# Patient Record
Sex: Female | Born: 1985 | Hispanic: No | Marital: Single | Smoking: Current every day smoker
Health system: Southern US, Community
[De-identification: ages and names within clinical notes are randomized; demographics above are authoritative.]

## PROBLEM LIST (undated history)

## (undated) ENCOUNTER — Inpatient Hospital Stay (HOSPITAL_COMMUNITY): Payer: Self-pay

## (undated) DIAGNOSIS — Z3490 Encounter for supervision of normal pregnancy, unspecified, unspecified trimester: Secondary | ICD-10-CM

## (undated) DIAGNOSIS — N83201 Unspecified ovarian cyst, right side: Secondary | ICD-10-CM

## (undated) DIAGNOSIS — E162 Hypoglycemia, unspecified: Secondary | ICD-10-CM

## (undated) DIAGNOSIS — T8859XA Other complications of anesthesia, initial encounter: Secondary | ICD-10-CM

## (undated) DIAGNOSIS — T4145XA Adverse effect of unspecified anesthetic, initial encounter: Secondary | ICD-10-CM

## (undated) DIAGNOSIS — Z30017 Encounter for initial prescription of implantable subdermal contraceptive: Secondary | ICD-10-CM

## (undated) HISTORY — PX: NO PAST SURGERIES: SHX2092

## (undated) HISTORY — DX: Encounter for supervision of normal pregnancy, unspecified, unspecified trimester: Z34.90

## (undated) HISTORY — DX: Encounter for initial prescription of implantable subdermal contraceptive: Z30.017

## (undated) HISTORY — DX: Unspecified ovarian cyst, right side: N83.201

## (undated) HISTORY — DX: Hypoglycemia, unspecified: E16.2

---
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
## 2014-04-14 ENCOUNTER — Encounter: Payer: Self-pay | Admitting: Adult Health

## 2014-04-14 ENCOUNTER — Ambulatory Visit (INDEPENDENT_AMBULATORY_CARE_PROVIDER_SITE_OTHER): Payer: Medicaid Other | Admitting: Adult Health

## 2014-04-14 VITALS — BP 130/74 | Ht 59.0 in | Wt 140.0 lb

## 2014-04-14 DIAGNOSIS — Z3490 Encounter for supervision of normal pregnancy, unspecified, unspecified trimester: Secondary | ICD-10-CM

## 2014-04-14 DIAGNOSIS — O09899 Supervision of other high risk pregnancies, unspecified trimester: Secondary | ICD-10-CM | POA: Insufficient documentation

## 2014-04-14 DIAGNOSIS — Z3201 Encounter for pregnancy test, result positive: Secondary | ICD-10-CM

## 2014-04-14 HISTORY — DX: Encounter for supervision of normal pregnancy, unspecified, unspecified trimester: Z34.90

## 2014-04-14 LAB — POCT URINE PREGNANCY: Preg Test, Ur: POSITIVE

## 2014-04-14 NOTE — Patient Instructions (Signed)
Second Trimester of Pregnancy The second trimester is from week 13 through week 28, months 4 through 6. The second trimester is often a time when you feel your best. Your body has also adjusted to being pregnant, and you begin to feel better physically. Usually, morning sickness has lessened or quit completely, you may have more energy, and you may have an increase in appetite. The second trimester is also a time when the fetus is growing rapidly. At the end of the sixth month, the fetus is about 9 inches long and weighs about 1 pounds. You will likely begin to feel the baby move (quickening) between 18 and 20 weeks of the pregnancy. BODY CHANGES Your body goes through many changes during pregnancy. The changes vary from woman to woman.   Your weight will continue to increase. You will notice your lower abdomen bulging out.  You may begin to get stretch marks on your hips, abdomen, and breasts.  You may develop headaches that can be relieved by medicines approved by your health care provider.  You may urinate more often because the fetus is pressing on your bladder.  You may develop or continue to have heartburn as a result of your pregnancy.  You may develop constipation because certain hormones are causing the muscles that push waste through your intestines to slow down.  You may develop hemorrhoids or swollen, bulging veins (varicose veins).  You may have back pain because of the weight gain and pregnancy hormones relaxing your joints between the bones in your pelvis and as a result of a shift in weight and the muscles that support your balance.  Your breasts will continue to grow and be tender.  Your gums may bleed and may be sensitive to brushing and flossing.  Dark spots or blotches (chloasma, mask of pregnancy) may develop on your face. This will likely fade after the baby is born.  A dark line from your belly button to the pubic area (linea nigra) may appear. This will likely fade  after the baby is born.  You may have changes in your hair. These can include thickening of your hair, rapid growth, and changes in texture. Some women also have hair loss during or after pregnancy, or hair that feels dry or thin. Your hair will most likely return to normal after your baby is born. WHAT TO EXPECT AT YOUR PRENATAL VISITS During a routine prenatal visit:  You will be weighed to make sure you and the fetus are growing normally.  Your blood pressure will be taken.  Your abdomen will be measured to track your baby's growth.  The fetal heartbeat will be listened to.  Any test results from the previous visit will be discussed. Your health care provider may ask you:  How you are feeling.  If you are feeling the baby move.  If you have had any abnormal symptoms, such as leaking fluid, bleeding, severe headaches, or abdominal cramping.  If you have any questions. Other tests that may be performed during your second trimester include:  Blood tests that check for:  Low iron levels (anemia).  Gestational diabetes (between 24 and 28 weeks).  Rh antibodies.  Urine tests to check for infections, diabetes, or protein in the urine.  An ultrasound to confirm the proper growth and development of the baby.  An amniocentesis to check for possible genetic problems.  Fetal screens for spina bifida and Down syndrome. HOME CARE INSTRUCTIONS   Avoid all smoking, herbs, alcohol, and unprescribed   drugs. These chemicals affect the formation and growth of the baby.  Follow your health care provider's instructions regarding medicine use. There are medicines that are either safe or unsafe to take during pregnancy.  Exercise only as directed by your health care provider. Experiencing uterine cramps is a good sign to stop exercising.  Continue to eat regular, healthy meals.  Wear a good support bra for breast tenderness.  Do not use hot tubs, steam rooms, or saunas.  Wear your  seat belt at all times when driving.  Avoid raw meat, uncooked cheese, cat litter boxes, and soil used by cats. These carry germs that can cause birth defects in the baby.  Take your prenatal vitamins.  Try taking a stool softener (if your health care provider approves) if you develop constipation. Eat more high-fiber foods, such as fresh vegetables or fruit and whole grains. Drink plenty of fluids to keep your urine clear or pale yellow.  Take warm sitz baths to soothe any pain or discomfort caused by hemorrhoids. Use hemorrhoid cream if your health care provider approves.  If you develop varicose veins, wear support hose. Elevate your feet for 15 minutes, 3-4 times a day. Limit salt in your diet.  Avoid heavy lifting, wear low heel shoes, and practice good posture.  Rest with your legs elevated if you have leg cramps or low back pain.  Visit your dentist if you have not gone yet during your pregnancy. Use a soft toothbrush to brush your teeth and be gentle when you floss.  A sexual relationship may be continued unless your health care provider directs you otherwise.  Continue to go to all your prenatal visits as directed by your health care provider. SEEK MEDICAL CARE IF:   You have dizziness.  You have mild pelvic cramps, pelvic pressure, or nagging pain in the abdominal area.  You have persistent nausea, vomiting, or diarrhea.  You have a bad smelling vaginal discharge.  You have pain with urination. SEEK IMMEDIATE MEDICAL CARE IF:   You have a fever.  You are leaking fluid from your vagina.  You have spotting or bleeding from your vagina.  You have severe abdominal cramping or pain.  You have rapid weight gain or loss.  You have shortness of breath with chest pain.  You notice sudden or extreme swelling of your face, hands, ankles, feet, or legs.  You have not felt your baby move in over an hour.  You have severe headaches that do not go away with  medicine.  You have vision changes. Document Released: 03/05/2001 Document Revised: 03/16/2013 Document Reviewed: 05/12/2012 ExitCare Patient Information 2015 [employer]. This information is not intended to replace advice given to you by your health care provider. Make sure you discuss any questions you have with your health care provider. Return in 1 week for US 

## 2014-04-14 NOTE — Progress Notes (Signed)
Subjective:     Patient ID: Lindsey Mccoy, female   DOB: 05/17/1985, 29 y.o.   MRN: [id number]  HPI Lindsey Mccoy is a 29 year old white female in for UPT.  Review of Systems See HPI Reviewed past medical,surgical, social and family history. Reviewed medications and allergies.     Objective:   Physical Exam BP 130/74 mmHg  Ht 4\' 11"  (1.499 m)  Wt 140 lb (63.504 kg)  BMI 28.26 kg/m2  LMP 09/23/2015UPT +, about 17 +1 week by LMP EDD 09/22/14    Assessment:     Pregnant +UPT    Plan:     Return in 1 week for dating US  Review handout on second trimester

## 2014-04-21 ENCOUNTER — Other Ambulatory Visit: Payer: Self-pay | Admitting: Adult Health

## 2014-04-21 ENCOUNTER — Encounter: Payer: Self-pay | Admitting: Adult Health

## 2014-04-21 ENCOUNTER — Ambulatory Visit (INDEPENDENT_AMBULATORY_CARE_PROVIDER_SITE_OTHER): Payer: Medicaid Other

## 2014-04-21 DIAGNOSIS — O3680X1 Pregnancy with inconclusive fetal viability, fetus 1: Secondary | ICD-10-CM

## 2014-04-21 DIAGNOSIS — O0932 Supervision of pregnancy with insufficient antenatal care, second trimester: Secondary | ICD-10-CM

## 2014-04-21 DIAGNOSIS — Z3490 Encounter for supervision of normal pregnancy, unspecified, unspecified trimester: Secondary | ICD-10-CM

## 2014-04-21 NOTE — Progress Notes (Signed)
U/S(18+1wks)-active fetus, meas c/w LMP dates, fluid WNL, posterior Gr 0 placenta, cx appears closed, bilateral adnexa appears WNL, female fetus, will complete anatomy screen at next U/S appt

## 2014-04-28 ENCOUNTER — Encounter: Payer: Self-pay | Admitting: Advanced Practice Midwife

## 2014-04-28 ENCOUNTER — Ambulatory Visit (INDEPENDENT_AMBULATORY_CARE_PROVIDER_SITE_OTHER): Payer: Medicaid Other | Admitting: Advanced Practice Midwife

## 2014-04-28 VITALS — BP 118/58 | Wt 142.0 lb

## 2014-04-28 DIAGNOSIS — Z8739 Personal history of other diseases of the musculoskeletal system and connective tissue: Secondary | ICD-10-CM

## 2014-04-28 DIAGNOSIS — Z1371 Encounter for nonprocreative screening for genetic disease carrier status: Secondary | ICD-10-CM

## 2014-04-28 DIAGNOSIS — O0932 Supervision of pregnancy with insufficient antenatal care, second trimester: Secondary | ICD-10-CM

## 2014-04-28 DIAGNOSIS — Z3482 Encounter for supervision of other normal pregnancy, second trimester: Secondary | ICD-10-CM

## 2014-04-28 DIAGNOSIS — Z118 Encounter for screening for other infectious and parasitic diseases: Secondary | ICD-10-CM

## 2014-04-28 DIAGNOSIS — Z113 Encounter for screening for infections with a predominantly sexual mode of transmission: Secondary | ICD-10-CM

## 2014-04-28 DIAGNOSIS — Z114 Encounter for screening for human immunodeficiency virus [HIV]: Secondary | ICD-10-CM

## 2014-04-28 DIAGNOSIS — Z331 Pregnant state, incidental: Secondary | ICD-10-CM

## 2014-04-28 DIAGNOSIS — Z0184 Encounter for antibody response examination: Secondary | ICD-10-CM

## 2014-04-28 DIAGNOSIS — Z87828 Personal history of other (healed) physical injury and trauma: Secondary | ICD-10-CM

## 2014-04-28 DIAGNOSIS — Z3492 Encounter for supervision of normal pregnancy, unspecified, second trimester: Secondary | ICD-10-CM

## 2014-04-28 DIAGNOSIS — Z3682 Encounter for antenatal screening for nuchal translucency: Secondary | ICD-10-CM

## 2014-04-28 DIAGNOSIS — Z1159 Encounter for screening for other viral diseases: Secondary | ICD-10-CM

## 2014-04-28 DIAGNOSIS — Z0283 Encounter for blood-alcohol and blood-drug test: Secondary | ICD-10-CM

## 2014-04-28 DIAGNOSIS — Z1389 Encounter for screening for other disorder: Secondary | ICD-10-CM

## 2014-04-28 DIAGNOSIS — Z3490 Encounter for supervision of normal pregnancy, unspecified, unspecified trimester: Secondary | ICD-10-CM

## 2014-04-28 LAB — POCT URINALYSIS DIPSTICK
GLUCOSE UA: NEGATIVE
Ketones, UA: NEGATIVE
Leukocytes, UA: NEGATIVE
Nitrite, UA: NEGATIVE
Protein, UA: NEGATIVE
RBC UA: NEGATIVE

## 2014-04-28 LAB — OB RESULTS CONSOLE ABO/RH: RH Type: POSITIVE

## 2014-04-28 LAB — OB RESULTS CONSOLE RUBELLA ANTIBODY, IGM: Rubella: IMMUNE

## 2014-04-28 MED ORDER — ONDANSETRON HCL 4 MG PO TABS: 4.0000 mg | ORAL_TABLET | Freq: Three times a day (TID) | ORAL | Status: DC | PRN

## 2014-04-28 NOTE — Progress Notes (Signed)
  Subjective:    Lindsey Mccoy is a [id number] 5767w1d being seen today for her first obstetrical visit.  Her obstetrical history is significant for postpartum hemorrhage.  Pregnancy history fully reviewed.  Patient reports peristant nausea and daily HA, sometimes frontal, usually in back of head.  + trigger point right trapezius.  Filed Vitals:   04/28/14 1150  BP: 152/78  Weight: 142 lb (64.411 kg)    HISTORY: OB History  Gravida Para Term Preterm AB SAB TAB Ectopic Multiple Living  3 1 1  0 1 1 0 0 0 1    # Outcome Date GA Lbr Len/2nd Weight Sex Delivery Anes PTL Lv  3 Current           2 Term 06/25/07 2724w0d   F Vag-Spont EPI N Y     Complications: Other Excessive Bleeding  1 SAB              Past Medical History  Diagnosis Date  . Hypoglycemia   . Cyst of ovary, right   . Pregnant 04/14/2014   Past Surgical History  Procedure Laterality Date  . No past surgeries     Family History  Problem Relation Age of Onset  . Heart disease Paternal Grandfather   . Cancer Paternal Grandmother     breast  . Asthma Maternal Grandmother   . Diabetes Father   . Hypertension Father   . Migraines Mother   . Pancreatitis Brother      Exam                                           Skin: normal coloration and turgor, no rashes    Neurologic: oriented, normal, normal mood   Extremities: normal strength, tone, and muscle mass   HEENT PERRLA   Mouth/Teeth mucous membranes moist,poor dentition   Neck supple and no masses   Cardiovascular: regular rate and rhythm   Respiratory:  appears well, vitals normal, no respiratory distress, acyanotic   Abdomen: soft, non-tender;  FHR: 150          Assessment:    Pregnancy: [id number] Patient Active Problem List   Diagnosis Date Noted  . Late prenatal care in second trimester 04/28/2014  . Pregnant 04/14/2014        Plan:     Initial labs drawn. Continue prenatal vitamins  Zofran 4mg  ODT Ice/massage/stretch for  occipital HA Problem list reviewed and updated  Reviewed n/v relief measures and warning s/s to report  Reviewed recommended weight gain based on pre-gravid BMI  Encouraged well-balanced diet Genetic Screening discussed Quad Screen: requested.  Ultrasound discussed; fetal survey: requested.  Follow up in 1 weeks for anatomy scan and 4 weeks for Low-risk ob appt  CRESENZO-DISHMAN,[name] 04/28/2014

## 2014-04-28 NOTE — Patient Instructions (Signed)

## 2014-04-28 NOTE — Addendum Note (Signed)
Addended by: Criss AlvinePULLIAM, CHRYSTAL G on: 04/28/2014 03:09 PM   Modules accepted: Orders

## 2014-04-29 ENCOUNTER — Other Ambulatory Visit: Payer: Self-pay | Admitting: *Deleted

## 2014-04-29 DIAGNOSIS — Z3682 Encounter for antenatal screening for nuchal translucency: Secondary | ICD-10-CM

## 2014-04-29 DIAGNOSIS — Z3490 Encounter for supervision of normal pregnancy, unspecified, unspecified trimester: Secondary | ICD-10-CM

## 2014-04-30 LAB — URINE CULTURE

## 2014-05-01 LAB — GC/CHLAMYDIA PROBE AMP
Chlamydia trachomatis, NAA: NEGATIVE
Neisseria gonorrhoeae by PCR: NEGATIVE

## 2014-05-03 LAB — URINALYSIS, ROUTINE W REFLEX MICROSCOPIC
BILIRUBIN UA: NEGATIVE
GLUCOSE, UA: NEGATIVE
Ketones, UA: NEGATIVE
LEUKOCYTES UA: NEGATIVE
Nitrite, UA: NEGATIVE
PH UA: 7 (ref 5.0–7.5)
PROTEIN UA: NEGATIVE
RBC, UA: NEGATIVE
SPEC GRAV UA: 1.005 (ref 1.005–1.030)
Urobilinogen, Ur: 0.2 mg/dL (ref 0.2–1.0)

## 2014-05-03 LAB — PMP SCREEN PROFILE (10S), URINE
AMPHETAMINE SCRN UR: NEGATIVE ng/mL
BARBITURATE SCRN UR: NEGATIVE ng/mL
Benzodiazepine Screen, Urine: NEGATIVE ng/mL
CANNABINOIDS UR QL SCN: NEGATIVE ng/mL
COCAINE(METAB.) SCREEN, URINE: NEGATIVE ng/mL
CREATININE(CRT), U: 24.9 mg/dL (ref 20.0–300.0)
METHADONE SCREEN, URINE: NEGATIVE ng/mL
OPIATE SCRN UR: NEGATIVE ng/mL
Oxycodone+Oxymorphone Ur Ql Scn: NEGATIVE ng/mL
PCP Scrn, Ur: NEGATIVE ng/mL
PH UR, DRUG SCRN: 6.7 (ref 4.5–8.9)
Propoxyphene, Screen: NEGATIVE ng/mL

## 2014-05-03 LAB — HEPATITIS B SURFACE ANTIGEN: HEP B S AG: NEGATIVE

## 2014-05-03 LAB — ANTIBODY SCREEN: ANTIBODY SCREEN: NEGATIVE

## 2014-05-03 LAB — ABO/RH: RH TYPE: POSITIVE

## 2014-05-03 LAB — RUBELLA SCREEN: Rubella Antibodies, IGG: 1.24 index (ref >0.99)

## 2014-05-03 LAB — CBC
HEMATOCRIT: 36.5 % (ref 34.0–46.6)
HEMOGLOBIN: 12.2 g/dL (ref 11.1–15.9)
MCH: 32 pg (ref 26.6–33.0)
MCHC: 33.4 g/dL (ref 31.5–35.7)
MCV: 96 fL (ref 79–97)
Platelets: 315 10*3/uL (ref 150–379)
RBC: 3.81 x10E6/uL (ref 3.77–5.28)
RDW: 13.8 % (ref 12.3–15.4)
WBC: 14 10*3/uL — ABNORMAL HIGH (ref 3.4–10.8)

## 2014-05-03 LAB — HIV ANTIBODY (ROUTINE TESTING W REFLEX): HIV Screen 4th Generation wRfx: NONREACTIVE

## 2014-05-03 LAB — VARICELLA ZOSTER ANTIBODY, IGG: Varicella zoster IgG: 931 index (ref >165)

## 2014-05-03 LAB — CYSTIC FIBROSIS MUTATION 97: GENE DIS ANAL CARRIER INTERP BLD/T-IMP: NOT DETECTED

## 2014-05-03 LAB — RPR: RPR Ser Ql: NONREACTIVE

## 2014-05-04 LAB — AFP, QUAD SCREEN
DIA MOM VALUE: 1.55
DIA VALUE (EIA): 315.86 pg/mL
DSR (By Age)    1 IN: 948
DSR (SECOND TRIMESTER) 1 IN: 552
Gestational Age: 19.3 WEEKS
MATERNAL AGE AT EDD: 26.6 a
MSAFP Mom: 0.62
MSAFP: 34.2 ng/mL
MSHCG Mom: 0.97
MSHCG: 24823 m[IU]/mL
OSB RISK: 10000
T18 (By Age): 1:3695 {titer}
Test Results:: NEGATIVE
UE3 MOM: 0.61
WEIGHT: 142 [lb_av]
uE3 Value: 1.11 ng/mL

## 2014-05-10 ENCOUNTER — Other Ambulatory Visit: Payer: Self-pay | Admitting: Advanced Practice Midwife

## 2014-05-10 ENCOUNTER — Ambulatory Visit (INDEPENDENT_AMBULATORY_CARE_PROVIDER_SITE_OTHER): Payer: Medicaid Other

## 2014-05-10 DIAGNOSIS — Z3689 Encounter for other specified antenatal screening: Secondary | ICD-10-CM

## 2014-05-10 DIAGNOSIS — Z36 Encounter for antenatal screening of mother: Secondary | ICD-10-CM

## 2014-05-10 DIAGNOSIS — Z87828 Personal history of other (healed) physical injury and trauma: Secondary | ICD-10-CM

## 2014-05-10 DIAGNOSIS — O09292 Supervision of pregnancy with other poor reproductive or obstetric history, second trimester: Secondary | ICD-10-CM

## 2014-05-10 DIAGNOSIS — Z8739 Personal history of other diseases of the musculoskeletal system and connective tissue: Principal | ICD-10-CM

## 2014-05-10 NOTE — Progress Notes (Signed)
U/S(20+6wks)-active fetus, FHR- 148 bpm, meas c/w dates, fluid WNL, posterior Gr 0 placenta, cx appears closed (3.2cm), bilateral adnexa appears WNL, no obvious abnl noted, female fetus

## 2014-05-26 ENCOUNTER — Ambulatory Visit (INDEPENDENT_AMBULATORY_CARE_PROVIDER_SITE_OTHER): Payer: Medicaid Other | Admitting: Advanced Practice Midwife

## 2014-05-26 VITALS — BP 130/70 | HR 88 | Wt 146.0 lb

## 2014-05-26 DIAGNOSIS — Z1389 Encounter for screening for other disorder: Secondary | ICD-10-CM

## 2014-05-26 DIAGNOSIS — Z3492 Encounter for supervision of normal pregnancy, unspecified, second trimester: Secondary | ICD-10-CM

## 2014-05-26 DIAGNOSIS — Z331 Pregnant state, incidental: Secondary | ICD-10-CM

## 2014-05-26 LAB — POCT URINALYSIS DIPSTICK
Blood, UA: NEGATIVE
Glucose, UA: NEGATIVE
KETONES UA: NEGATIVE
LEUKOCYTES UA: NEGATIVE
Nitrite, UA: NEGATIVE
Protein, UA: NEGATIVE

## 2014-05-26 NOTE — Patient Instructions (Signed)

## 2014-05-26 NOTE — Progress Notes (Signed)
[id number] 2010w1d Estimated Date of Delivery: 09/21/14  Blood pressure 130/70, pulse 88, weight 146 lb (66.225 kg), last menstrual period 12/15/2013.   BP weight and urine results all reviewed and noted.  Please refer to the obstetrical flow sheet for the fundal height and fetal heart rate documentation:  Patient reports good fetal movement, denies any bleeding and no rupture of membranes symptoms or regular contractions. Patient is without complaints other than bilateral tingling in hands, no pain All questions were answered.  Plan:  Continued routine obstetrical care, may splint wrists at night  Follow up in 4 weeks for OB appointment, PN2

## 2014-05-27 ENCOUNTER — Other Ambulatory Visit: Payer: Medicaid Other

## 2014-06-23 ENCOUNTER — Ambulatory Visit (INDEPENDENT_AMBULATORY_CARE_PROVIDER_SITE_OTHER): Payer: Medicaid Other | Admitting: Obstetrics & Gynecology

## 2014-06-23 ENCOUNTER — Other Ambulatory Visit: Payer: Medicaid Other

## 2014-06-23 VITALS — BP 120/60 | HR 96 | Wt 145.0 lb

## 2014-06-23 DIAGNOSIS — Z3483 Encounter for supervision of other normal pregnancy, third trimester: Secondary | ICD-10-CM

## 2014-06-23 DIAGNOSIS — Z331 Pregnant state, incidental: Secondary | ICD-10-CM

## 2014-06-23 DIAGNOSIS — Z0184 Encounter for antibody response examination: Secondary | ICD-10-CM

## 2014-06-23 DIAGNOSIS — Z1389 Encounter for screening for other disorder: Secondary | ICD-10-CM

## 2014-06-23 DIAGNOSIS — Z113 Encounter for screening for infections with a predominantly sexual mode of transmission: Secondary | ICD-10-CM

## 2014-06-23 DIAGNOSIS — Z114 Encounter for screening for human immunodeficiency virus [HIV]: Secondary | ICD-10-CM

## 2014-06-23 DIAGNOSIS — Z131 Encounter for screening for diabetes mellitus: Secondary | ICD-10-CM

## 2014-06-23 DIAGNOSIS — Z3492 Encounter for supervision of normal pregnancy, unspecified, second trimester: Secondary | ICD-10-CM

## 2014-06-23 LAB — POCT URINALYSIS DIPSTICK
GLUCOSE UA: NEGATIVE
KETONES UA: NEGATIVE
LEUKOCYTES UA: NEGATIVE
NITRITE UA: NEGATIVE
Protein, UA: NEGATIVE

## 2014-06-23 LAB — OB RESULTS CONSOLE HIV ANTIBODY (ROUTINE TESTING): HIV: NONREACTIVE

## 2014-06-23 MED ORDER — HYDROCORTISONE 2.5 % EX CREA: TOPICAL_CREAM | Freq: Two times a day (BID) | CUTANEOUS | Status: DC

## 2014-06-23 NOTE — Progress Notes (Signed)
[id number] 6847w1d Estimated Date of Delivery: 09/21/14  Blood pressure 120/60, pulse 96, weight 145 lb (65.772 kg), last menstrual period 12/15/2013.   BP weight and urine results all reviewed and noted.  Please refer to the obstetrical flow sheet for the fundal height and fetal heart rate documentation:  Patient reports good fetal movement, denies any bleeding and no rupture of membranes symptoms or regular contractions. Patient is without complaints. All questions were answered.  Plan:  Continued routine obstetrical care,   Follow up in 3 weeks for OB appointment,   2% hydrocortisone for her arms, looks like sensitivity

## 2014-06-24 LAB — CBC
HEMATOCRIT: 35.1 % (ref 34.0–46.6)
HEMOGLOBIN: 11.9 g/dL (ref 11.1–15.9)
MCH: 32.2 pg (ref 26.6–33.0)
MCHC: 33.9 g/dL (ref 31.5–35.7)
MCV: 95 fL (ref 79–97)
Platelets: 308 10*3/uL (ref 150–379)
RBC: 3.7 x10E6/uL — AB (ref 3.77–5.28)
RDW: 13.5 % (ref 12.3–15.4)
WBC: 16.7 10*3/uL — AB (ref 3.4–10.8)

## 2014-06-24 LAB — GLUCOSE TOLERANCE, 2 HOURS W/ 1HR
Glucose, 1 hour: 161 mg/dL (ref 65–179)
Glucose, 2 hour: 126 mg/dL (ref 65–152)
Glucose, Fasting: 85 mg/dL (ref 65–91)

## 2014-06-24 LAB — ANTIBODY SCREEN: ANTIBODY SCREEN: NEGATIVE

## 2014-06-24 LAB — RPR: RPR: NONREACTIVE

## 2014-06-24 LAB — HIV ANTIBODY (ROUTINE TESTING W REFLEX): HIV SCREEN 4TH GENERATION: NONREACTIVE

## 2014-06-24 LAB — HSV 2 ANTIBODY, IGG: HSV 2 Glycoprotein G Ab, IgG: <0.91 index (ref 0.00–0.90)

## 2014-07-14 ENCOUNTER — Encounter: Payer: Self-pay | Admitting: Advanced Practice Midwife

## 2014-07-14 ENCOUNTER — Ambulatory Visit (INDEPENDENT_AMBULATORY_CARE_PROVIDER_SITE_OTHER): Payer: Medicaid Other | Admitting: Advanced Practice Midwife

## 2014-07-14 VITALS — BP 120/60 | HR 84 | Wt 142.0 lb

## 2014-07-14 DIAGNOSIS — Z3490 Encounter for supervision of normal pregnancy, unspecified, unspecified trimester: Secondary | ICD-10-CM

## 2014-07-14 DIAGNOSIS — Z3493 Encounter for supervision of normal pregnancy, unspecified, third trimester: Secondary | ICD-10-CM

## 2014-07-14 DIAGNOSIS — Z1389 Encounter for screening for other disorder: Secondary | ICD-10-CM

## 2014-07-14 DIAGNOSIS — Z331 Pregnant state, incidental: Secondary | ICD-10-CM

## 2014-07-14 LAB — POCT URINALYSIS DIPSTICK
Blood, UA: NEGATIVE
Glucose, UA: NEGATIVE
Ketones, UA: NEGATIVE
Leukocytes, UA: NEGATIVE
Nitrite, UA: NEGATIVE
Protein, UA: NEGATIVE

## 2014-07-14 NOTE — Progress Notes (Signed)
[id number] 349w1d Estimated Date of Delivery: 09/21/14  Blood pressure 120/60, pulse 84, weight 142 lb (64.411 kg), last menstrual period 12/15/2013.   BP weight and urine results all reviewed and noted.  Please refer to the obstetrical flow sheet for the fundal height and fetal heart rate documentation:  States that she "eats all the time".  Ate sausage egg and cheese biscuit with hashbrowns this am  Patient reports good fetal movement, denies any bleeding and no rupture of membranes symptoms or regular contractions. Patient is without complaints. All questions were answered.  Plan:  Continued routine obstetrical care,   Follow up in 2 weeks for OB appointment,

## 2014-07-26 ENCOUNTER — Encounter: Payer: Self-pay | Admitting: Obstetrics & Gynecology

## 2014-07-26 ENCOUNTER — Ambulatory Visit (INDEPENDENT_AMBULATORY_CARE_PROVIDER_SITE_OTHER): Payer: Medicaid Other | Admitting: Obstetrics & Gynecology

## 2014-07-26 VITALS — BP 110/60 | HR 92 | Wt 145.0 lb

## 2014-07-26 DIAGNOSIS — G43101 Migraine with aura, not intractable, with status migrainosus: Secondary | ICD-10-CM

## 2014-07-26 DIAGNOSIS — Z3493 Encounter for supervision of normal pregnancy, unspecified, third trimester: Secondary | ICD-10-CM

## 2014-07-26 DIAGNOSIS — Z331 Pregnant state, incidental: Secondary | ICD-10-CM

## 2014-07-26 DIAGNOSIS — Z1389 Encounter for screening for other disorder: Secondary | ICD-10-CM

## 2014-07-26 LAB — POCT URINALYSIS DIPSTICK
Blood, UA: NEGATIVE
Glucose, UA: NEGATIVE
Ketones, UA: NEGATIVE
NITRITE UA: NEGATIVE
Protein, UA: NEGATIVE

## 2014-07-26 MED ORDER — BUTALBITAL-APAP-CAFFEINE 50-325-40 MG PO TABS: 1.0000 | ORAL_TABLET | Freq: Four times a day (QID) | ORAL | Status: DC | PRN

## 2014-07-26 NOTE — Progress Notes (Signed)
[id number] 9046w6d Estimated Date of Delivery: 09/21/14  Blood pressure 110/60, pulse 92, weight 145 lb (65.772 kg), last menstrual period 12/15/2013.   BP weight and urine results all reviewed and noted.  Please refer to the obstetrical flow sheet for the fundal height and fetal heart rate documentation:  Patient reports good fetal movement, denies any bleeding and no rupture of membranes symptoms or regular contractions. Patient is without complaints. All questions were answered.  Plan:  Continued routine obstetrical care, **  Follow up in 2 weeks for OB appointment,  Has headache in right eye with scotomata last night( has long history of migraines, age 29) Used 0xygen therapy and her photophobia has resolved now headache down to a 3 Rx Fioricet, not ideal of course but may help the inflammatory component

## 2014-08-10 ENCOUNTER — Ambulatory Visit (INDEPENDENT_AMBULATORY_CARE_PROVIDER_SITE_OTHER): Payer: Medicaid Other | Admitting: Women's Health

## 2014-08-10 ENCOUNTER — Encounter: Payer: Self-pay | Admitting: Women's Health

## 2014-08-10 VITALS — BP 118/62 | HR 92 | Wt 140.0 lb

## 2014-08-10 DIAGNOSIS — Z1389 Encounter for screening for other disorder: Secondary | ICD-10-CM

## 2014-08-10 DIAGNOSIS — Z331 Pregnant state, incidental: Secondary | ICD-10-CM

## 2014-08-10 DIAGNOSIS — Z3493 Encounter for supervision of normal pregnancy, unspecified, third trimester: Secondary | ICD-10-CM

## 2014-08-10 LAB — POCT URINALYSIS DIPSTICK
Blood, UA: NEGATIVE
GLUCOSE UA: NEGATIVE
LEUKOCYTES UA: NEGATIVE
Nitrite, UA: NEGATIVE
PROTEIN UA: NEGATIVE

## 2014-08-10 NOTE — Progress Notes (Signed)
Low-risk OB appointment G3P1011 1814w0d Estimated Date of Delivery: 09/21/14 BP 118/62 mmHg  Pulse 92  Wt 140 lb (63.504 kg)  LMP 12/15/2013  BP, weight, and urine reviewed.  Refer to obstetrical flow sheet for FH & FHR.  Reports good fm.  Denies regular uc's, lof, vb, or uti s/s. No complaints. Concerned that she hasn't gained any weight- states she eats all the time, no n/v/d. FH adequate- reassured.  Reviewed ptl s/s, fkc. Plan:  Continue routine obstetrical care  F/U in 2wks for OB appointment

## 2014-08-10 NOTE — Patient Instructions (Signed)
Circumc[facility]: $507 at hospital, [facility], has to be paid up front before it is done. If you [facility] you can make payments during pregnancy. If you are interested in this, see receptionist at check-out.  If your baby is older than 28 days when you have the [facility]cision done at [facility], the fee will go up to $325.50.    [place] Pediatricians/Family Doctors:  [facility] [phone]            [facility] [phone]                 [facility] [phone] (usually not accepting new patients unless you have family there already, you are always welcome to call and ask)            [facility] [phone]([address] [place]) [phone]   [facility]ians/Family Doctors:   [facility]: [phone]  [facility]: [phone]   Call the office [phone]) or go to [facility] if:  You begin to have strong, frequent contractions  Your water breaks.  Sometimes it is a big gush of fluid, sometimes it is just a trickle that keeps getting your panties wet or running down your legs  You have vaginal bleeding.  It is normal to have a small amount of spotting if your cervix was checked.   You don't feel your baby moving like normal.  If you don't, get you something to eat and drink and lay down and focus on feeling your baby move.  You should feel at least 10 movements in 2 hours.  If you don't, you should call the office or go to [facility].    Preterm Labor Information Preterm labor is when labor starts at less than 37 weeks of pregnancy. The normal length of a pregnancy is 39 to 41 weeks. CAUSES Often, there is no identifiable underlying cause as to why a woman goes into preterm labor. One of the most common known causes of preterm labor is infection. Infections of the uterus, cervix, vagina, amniotic sac, bladder, kidney, or even the lungs  (pneumonia) can cause labor to start. Other suspected causes of preterm labor include:  6. Urogenital infections, such as yeast infections and bacterial vaginosis.  7. Uterine abnormalities (uterine shape, uterine septum, fibroids, or bleeding from the placenta).  8. A cervix that has been operated on (it may fail to stay closed).  9. Malformations in the fetus.  10. Multiple gestations (twins, triplets, and so on).  11. Breakage of the amniotic sac.  RISK FACTORS 2. Having a previous history of preterm labor.  3. Having premature rupture of membranes (PROM).  4. Having a placenta that covers the opening of the cervix (placenta previa).  5. Having a placenta that separates from the uterus (placental abruption).  6. Having a cervix that is too weak to hold the fetus in the uterus (incompetent cervix).  7. Having too much fluid in the amniotic sac (polyhydramnios).  8. Taking illegal drugs or smoking while pregnant.  9. Not gaining enough weight while pregnant.  10. Being younger than 2918 and older than 27 years old.  11. Having a low socioeconomic status.  12. Being African American. SYMPTOMS Signs and symptoms of preterm labor include:  2. Menstrual-like cramps, abdominal pain, or back pain. 3. Uterine contractions that are regular, as frequent as six in an hour, regardless of their intensity (may be mild or painful). 4. Contractions  that start on the top of the uterus and spread down to the lower abdomen and back.  5. A sense of increased pelvic pressure.  6. A watery or bloody mucus discharge that comes from the vagina.  TREATMENT Depending on the length of the pregnancy and other circumstances, your health care provider may suggest bed rest. If necessary, there are medicines that can be given to stop contractions and to mature the fetal lungs. If labor happens before 34 weeks of pregnancy, a prolonged hospital stay may be recommended. Treatment depends on the condition  of both you and the fetus.  WHAT SHOULD YOU DO IF YOU THINK YOU ARE IN PRETERM LABOR? Call your health care provider right away. You will need to go to the hospital to get checked immediately. HOW CAN YOU PREVENT PRETERM LABOR IN FUTURE PREGNANCIES? You should:  2. Stop smoking if you smoke. 3. Maintain healthy weight gain and avoid chemicals and drugs that are not necessary. 4. Be watchful for any type of infection. 5. Inform your health care provider if you have a known history of preterm labor. Document Released: 06/01/2003 Document Revised: 11/11/2012 Document Reviewed: 04/13/2012 [facility] Patient Information 2015 [place]. This information is not intended to replace advice given to you by your health care provider. Make sure you discuss any questions you have with your health care provider.

## 2014-08-24 ENCOUNTER — Encounter: Payer: Self-pay | Admitting: Advanced Practice Midwife

## 2014-08-24 ENCOUNTER — Ambulatory Visit (INDEPENDENT_AMBULATORY_CARE_PROVIDER_SITE_OTHER): Payer: Medicaid Other | Admitting: Advanced Practice Midwife

## 2014-08-24 VITALS — BP 110/60 | HR 76 | Wt 145.0 lb

## 2014-08-24 DIAGNOSIS — Z3493 Encounter for supervision of normal pregnancy, unspecified, third trimester: Secondary | ICD-10-CM

## 2014-08-24 DIAGNOSIS — O26843 Uterine size-date discrepancy, third trimester: Secondary | ICD-10-CM

## 2014-08-24 DIAGNOSIS — Z1389 Encounter for screening for other disorder: Secondary | ICD-10-CM

## 2014-08-24 DIAGNOSIS — Z331 Pregnant state, incidental: Secondary | ICD-10-CM

## 2014-08-24 LAB — POCT URINALYSIS DIPSTICK
Blood, UA: NEGATIVE
Glucose, UA: NEGATIVE
Ketones, UA: NEGATIVE
Leukocytes, UA: NEGATIVE
Nitrite, UA: NEGATIVE
Protein, UA: NEGATIVE

## 2014-08-24 NOTE — Patient Instructions (Signed)
Nothing to eat or drink after midnight 6/8.  Come to Maternity Admissions Unit 6/9 at 7:30am for an External Cephalic Version

## 2014-08-24 NOTE — Progress Notes (Signed)
[id number] 6734w0d Estimated Date of Delivery: 09/21/14  Blood pressure 110/60, pulse 76, weight 145 lb (65.772 kg), last menstrual period 12/15/2013.   BP weight and urine results all reviewed and noted.  Please refer to the obstetrical flow sheet for the fundal height and fetal heart rate documentation: FH low today, but US reveals back down transverse lie.   Patient reports good fetal movement, denies any bleeding and no rupture of membranes symptoms or regular contractions. Patient states she has "leaking" sometimes where "a lot of water comes out".  SSE; no pooling, negative fern and valsalva.  All questions were answered.  Plan:  Continued routine obstetrical care,   Follow up in 1 weeks for OB appointment, GBS, EFW/AFI.  ECV scheduled 6/9 @ 0730

## 2014-08-25 ENCOUNTER — Encounter (HOSPITAL_COMMUNITY): Payer: Self-pay | Admitting: *Deleted

## 2014-08-25 ENCOUNTER — Telehealth (HOSPITAL_COMMUNITY): Payer: Self-pay | Admitting: *Deleted

## 2014-08-25 NOTE — Telephone Encounter (Signed)
Preadmission screen  

## 2014-08-31 ENCOUNTER — Other Ambulatory Visit: Payer: Self-pay | Admitting: Advanced Practice Midwife

## 2014-08-31 ENCOUNTER — Ambulatory Visit (INDEPENDENT_AMBULATORY_CARE_PROVIDER_SITE_OTHER): Payer: Medicaid Other | Admitting: Women's Health

## 2014-08-31 ENCOUNTER — Ambulatory Visit (INDEPENDENT_AMBULATORY_CARE_PROVIDER_SITE_OTHER): Payer: Medicaid Other

## 2014-08-31 ENCOUNTER — Encounter: Payer: Self-pay | Admitting: Women's Health

## 2014-08-31 VITALS — BP 102/60 | HR 84 | Wt 141.0 lb

## 2014-08-31 DIAGNOSIS — O403XX0 Polyhydramnios, third trimester, not applicable or unspecified: Secondary | ICD-10-CM

## 2014-08-31 DIAGNOSIS — Z369 Encounter for antenatal screening, unspecified: Secondary | ICD-10-CM

## 2014-08-31 DIAGNOSIS — Z36 Encounter for antenatal screening of mother: Secondary | ICD-10-CM

## 2014-08-31 DIAGNOSIS — O403XX1 Polyhydramnios, third trimester, fetus 1: Secondary | ICD-10-CM

## 2014-08-31 DIAGNOSIS — O26843 Uterine size-date discrepancy, third trimester: Secondary | ICD-10-CM | POA: Diagnosis not present

## 2014-08-31 DIAGNOSIS — Z331 Pregnant state, incidental: Secondary | ICD-10-CM

## 2014-08-31 DIAGNOSIS — O0932 Supervision of pregnancy with insufficient antenatal care, second trimester: Secondary | ICD-10-CM

## 2014-08-31 DIAGNOSIS — O09893 Supervision of other high risk pregnancies, third trimester: Secondary | ICD-10-CM

## 2014-08-31 DIAGNOSIS — Z1389 Encounter for screening for other disorder: Secondary | ICD-10-CM

## 2014-08-31 DIAGNOSIS — Z3685 Encounter for antenatal screening for Streptococcus B: Secondary | ICD-10-CM

## 2014-08-31 DIAGNOSIS — Z3493 Encounter for supervision of normal pregnancy, unspecified, third trimester: Secondary | ICD-10-CM

## 2014-08-31 LAB — POCT URINALYSIS DIPSTICK
Blood, UA: NEGATIVE
Glucose, UA: NEGATIVE
KETONES UA: NEGATIVE
LEUKOCYTES UA: NEGATIVE
NITRITE UA: NEGATIVE
PROTEIN UA: NEGATIVE

## 2014-08-31 LAB — OB RESULTS CONSOLE GC/CHLAMYDIA
Chlamydia: NEGATIVE
GC PROBE AMP, GENITAL: NEGATIVE

## 2014-08-31 NOTE — Addendum Note (Signed)
Addended by: Criss AlvinePULLIAM, CHRYSTAL G on: 08/31/2014 01:04 PM   Modules accepted: Orders

## 2014-08-31 NOTE — Patient Instructions (Signed)
Call the office ([phone]) or go to [facility] if:  You begin to have strong, frequent contractions  Your water breaks.  Sometimes it is a big gush of fluid, sometimes it is just a trickle that keeps getting your panties wet or running down your legs  You have vaginal bleeding.  It is normal to have a small amount of spotting if your cervix was checked.   You don't feel your baby moving like normal.  If you don't, get you something to eat and drink and lay down and focus on feeling your baby move.  You should feel at least 10 movements in 2 hours.  If you don't, you should call the office or go to [facility].    Braxton Hicks Contractions Contractions of the uterus can occur throughout pregnancy. Contractions are not always a sign that you are in labor.  WHAT ARE BRAXTON HICKS CONTRACTIONS?  Contractions that occur before labor are called Braxton Hicks contractions, or false labor. Toward the end of pregnancy (32-34 weeks), these contractions can develop more often and may become more forceful. This is not true labor because these contractions do not result in opening (dilatation) and thinning of the cervix. They are sometimes difficult to tell apart from true labor because these contractions can be forceful and people have different pain tolerances. You should not feel embarrassed if you go to the hospital with false labor. Sometimes, the only way to tell if you are in true labor is for your health care provider to look for changes in the cervix. If there are no prenatal problems or other health problems associated with the pregnancy, it is completely safe to be sent home with false labor and await the onset of true labor. HOW CAN YOU TELL THE DIFFERENCE BETWEEN TRUE AND FALSE LABOR? False Labor  The contractions of false labor are usually shorter and not as hard as those of true labor.   The contractions are usually irregular.   The contractions are often felt in the front of  the lower abdomen and in the groin.   The contractions may go away when you walk around or change positions while lying down.   The contractions get weaker and are shorter lasting as time goes on.   The contractions do not usually become progressively stronger, regular, and closer together as with true labor.  True Labor  Contractions in true labor last 30-70 seconds, become very regular, usually become more intense, and increase in frequency.   The contractions do not go away with walking.   The discomfort is usually felt in the top of the uterus and spreads to the lower abdomen and low back.   True labor can be determined by your health care provider with an exam. This will show that the cervix is dilating and getting thinner.  WHAT TO REMEMBER  Keep up with your usual exercises and follow other instructions given by your health care provider.   Take medicines as directed by your health care provider.   Keep your regular prenatal appointments.   Eat and drink lightly if you think you are going into labor.   If Braxton Hicks contractions are making you uncomfortable:   Change your position from lying down or resting to walking, or from walking to resting.   Sit and rest in a tub of warm water.   Drink 2-3 glasses of water. Dehydration may cause these contractions.   Do slow and deep breathing several times an hour.    WHEN SHOULD I SEEK IMMEDIATE MEDICAL CARE? Seek immediate medical care if:  Your contractions become stronger, more regular, and closer together.   You have fluid leaking or gushing from your vagina.   You have a fever.   You pass blood-tinged mucus.   You have vaginal bleeding.   You have continuous abdominal pain.   You have low back pain that you never had before.   You feel your baby's head pushing down and causing pelvic pressure.   Your baby is not moving as much as it used to.  Document Released: 03/11/2005 Document  Revised: 03/16/2013 Document Reviewed: 12/21/2012 ExitCare Patient Information 2015 [employer]. This information is not intended to replace advice given to you by your health care provider. Make sure you discuss any questions you have with your health care provider.  

## 2014-08-31 NOTE — Progress Notes (Signed)
High Risk Pregnancy Diagnosis(es): Polyhydramnios, breech G3P1011 2244w0d Estimated Date of Delivery: 09/21/14 BP 102/60 mmHg  Pulse 84  Wt 141 lb (63.957 kg)  LMP 12/15/2013  Urinalysis: Negative HPI:  Doing well BP, weight, and urine reviewed.  Reports good fm. Denies regular uc's, lof, vb, uti s/s. No complaints.  Fundal Height:  34 Fetal Heart rate:  147 u/s Edema:  trace GBS collected SVE per request: 1.5/50/ballotable, breech  Reviewed u/s today for s<d, which revealed polyhydramnios w/ AFI 26cm, efw 50.4%, breech, bpp 8/8. Is scheduled tomorrow for ECV All questions were answered Assessment: 5444w0d polydramnios, breech Medication(s) Plans:  n/a Treatment Plan:  Begin 2x/wk testing, IOL @ 39wks Follow up monday for high-risk OB appt and NST [facility]

## 2014-08-31 NOTE — Progress Notes (Addendum)
US 37WKS measurements c/w dates,afi 26cm polyhydramnios,fundal pl gr3,breech,normal ov's bilat,efw 3057g 50.4%,fht 147bpm,bpp 8/8

## 2014-09-01 ENCOUNTER — Encounter (HOSPITAL_COMMUNITY): Payer: Self-pay

## 2014-09-01 ENCOUNTER — Observation Stay (HOSPITAL_COMMUNITY)
Admission: RE | Admit: 2014-09-01 | Discharge: 2014-09-01 | Disposition: A | Discharge status: Home / Self Care | Payer: Medicaid Other | Source: Ambulatory Visit | Attending: Obstetrics & Gynecology | Admitting: Obstetrics & Gynecology

## 2014-09-01 VITALS — BP 114/71 | HR 89 | Temp 98.2°F | Resp 18 | Ht 60.5 in | Wt 141.0 lb

## 2014-09-01 DIAGNOSIS — O321XX0 Maternal care for breech presentation, not applicable or unspecified: Principal | ICD-10-CM | POA: Diagnosis present

## 2014-09-01 DIAGNOSIS — Z3A37 37 weeks gestation of pregnancy: Secondary | ICD-10-CM | POA: Insufficient documentation

## 2014-09-01 DIAGNOSIS — Z880 Allergy status to penicillin: Secondary | ICD-10-CM

## 2014-09-01 DIAGNOSIS — O09893 Supervision of other high risk pregnancies, third trimester: Secondary | ICD-10-CM

## 2014-09-01 DIAGNOSIS — O0932 Supervision of pregnancy with insufficient antenatal care, second trimester: Secondary | ICD-10-CM

## 2014-09-01 DIAGNOSIS — Z888 Allergy status to other drugs, medicaments and biological substances status: Secondary | ICD-10-CM | POA: Insufficient documentation

## 2014-09-01 DIAGNOSIS — Z885 Allergy status to narcotic agent status: Secondary | ICD-10-CM | POA: Diagnosis not present

## 2014-09-01 DIAGNOSIS — O99333 Smoking (tobacco) complicating pregnancy, third trimester: Secondary | ICD-10-CM | POA: Diagnosis not present

## 2014-09-01 LAB — OB RESULTS CONSOLE GBS: GBS: NEGATIVE

## 2014-09-01 MED ORDER — LACTATED RINGERS IV SOLN
INTRAVENOUS | Status: DC
  Administered 2014-09-01: 08:00:00 via INTRAVENOUS

## 2014-09-01 MED ORDER — TERBUTALINE SULFATE 1 MG/ML IJ SOLN
0.2500 mg | Freq: Once | INTRAMUSCULAR | Status: AC
  Administered 2014-09-01: 0.25 mg via SUBCUTANEOUS
  Filled 2014-09-01: qty 1

## 2014-09-01 NOTE — Discharge Instructions (Signed)
External Cephalic Version External cephalic version is turning a baby that is presenting his or her buttocks first (breech) or is lying sideways in the uterus (transverse) to a head-first position. This makes the labor and delivery faster, safer for the mother and baby, and lessens the chance for a cesarean section. It should not be tried until the pregnancy is [redacted] weeks along or longer. BEFORE THE PROCEDURE   Do not take aspirin.  Do not eat for 4 hours before the procedure.  Tell your caregiver if you have a cold, fever, or an infection.  Tell your caregiver if you are having contractions.  Tell your caregiver if you are leaking or had a gush of fluid from your vagina.  Tell your caregiver if you have any vaginal bleeding or abnormal discharge.  If you are being admitted the same day, arrive at the hospital at least one hour before the procedure to sign any necessary documents and to get prepared for the procedure.  Tell your caregiver if you had any problems with anesthetics in the past.  Tell your caregiver if you are taking any medications that your caregiver does not know about. This includes over-the-counter and prescription drugs, herbs, eye drops and creams. PROCEDURE  First, an ultrasound is done to make sure the baby is breech or transverse.  A non-stress test or biophysical profile is done on the baby before the ECV. This is done to make sure it is safe for the baby to have the ECV. It may also be done after the procedure to make sure the baby is okay.  ECV is done in the delivery/surgical room with an anesthesiologist present. There should be a setup for an emergency cesarean section with a full nursing and nursery staff available and ready.  The patient may be given a medication to relax the uterine muscles. An epidural may be given for any discomfort. It is helpful for the success of the ECV.  An electronic fetal monitor is placed on the uterus during the procedure to  make sure the baby is okay.  If the mother is Rh-negative, Rho (D) immune globulin will be given to her to prevent Rh problems for future pregnancies.  The mother is followed closely for 2 to 3 hours after the procedure to make sure no problems develop. BENEFITS OF ECV  Easier and safer labor and delivery for the mother and baby.  Lower incidence of cesarean section.  Lower costs with a vaginal delivery. RISKS OF ECV  The placenta pulls away from the wall of the uterus before delivery (abruption of the placenta).  Rupture of the uterus, especially in patients with a previous cesarean section.  Fetal distress.  Early (premature) labor.  Premature rupture of the membranes.  The baby will return to the breech or transverse lie position.  Death of the fetus can happen but is very rare. ECV SHOULD BE STOPPED IF:  The fetal heart tones drop.  The mother is having a lot of pain.  You cannot turn the baby after several attempts. ECV SHOULD NOT BE DONE IF:  The non-stress test or biophysical profile is abnormal.  There is vaginal bleeding.  An abnormal shaped uterus is present.  There is heart disease or uncontrolled high blood pressure in the mother.  There are twins or more.  The placenta covers the opening of the cervix (placenta previa).  You had a previous cesarean section with a classical incision or major surgery of the uterus.  There   is not enough amniotic fluid in the sac (oligohydramnios).  The baby is too small for the pregnancy or has not developed normally (anomaly).  Your membranes have ruptured. HOME CARE INSTRUCTIONS   Have someone take you home after the procedure.  Rest at home for several hours.  Have someone stay with you for a few hours after you get home.  After ECV, continue with your prenatal visits as directed.  Continue your regular diet, rest and activities.  Do not do any strenuous activities for a couple of days. SEEK IMMEDIATE  MEDICAL CARE IF:   You develop vaginal bleeding.  You have fluid coming out of your vagina (bag of water may have broken).  You develop uterine contractions.  You do not feel the baby move or there is less movement of the baby.  You develop abdominal pain.  You develop an oral temperature of 102 F (38.9 C) or higher. Document Released: 09/03/2006 Document Revised: 07/26/2013 Document Reviewed: 06/29/2008 Exit[employer] Patient Information 2015 [employer]. This information is not intended to replace advice given to you by your health care provider. Make sure you discuss any questions you have with your health care provider.  

## 2014-09-01 NOTE — Op Note (Signed)
After informed verbal consent, Terbutaline 0.25 mg SQ given, ECV was attempted under Ultrasound guidance. The fetus was successfully converted to cephalic presentation.   FHR was reactive before and after the procedure.   Pt. Tolerated the procedure well. There were no immediate complications.  Rykin Route L. Harraway-Smith, M.D., [place] [place]

## 2014-09-01 NOTE — Progress Notes (Signed)
Pt discharge instructions given.  States good understanding.  D/c'd home amb.

## 2014-09-01 NOTE — Discharge Summary (Signed)
Antenatal Physician Discharge Summary  Patient ID: Lindsey Mccoy MRN: [id number] DOB/AGE: 19-Jun-1985 29 y.o.  Admit date: 09/01/2014 Discharge date: 09/01/2014  Admission Diagnoses: breech presentation  Discharge Diagnoses: breech presentation  Prenatal Procedures: none  Significant Diagnostic Studies:  Results for orders placed or performed in visit on 08/31/14 (from the past 168 hour(s))  POCT urinalysis dipstick   Collection Time: 08/31/14  1:08 PM  Result Value Ref Range   Color, UA     Clarity, UA     Glucose, UA neg    Bilirubin, UA     Ketones, UA neg    Spec Grav, UA     Blood, UA neg    pH, UA     Protein, UA neg    Urobilinogen, UA     Nitrite, UA neg    Leukocytes, UA Negative   Results for orders placed or performed in visit on 08/31/14 (from the past 168 hour(s))  US OB Follow Up   Collection Time: 08/31/14 12:16 PM  Result Value Ref Range   Biparietal Diameter  cm   Abdominal Circumference  cm   Femoral Diameter  cm   Head Circumference  cm   HC/AC     Estimated Fetal Weight  grams    Treatments: procedures: External cephalic version  Hospital Course:  Pt was admitted for observation.  She underwent a successful ECV and was discharged to home after fetal monitoring. She will f/u in ofc   She was deemed stable for discharge to home with outpatient follow up.  Discharge Exam: BP 114/71 mmHg  Pulse 89  Temp(Src) 98.2 F (36.8 C) (Oral)  Resp 18  Ht 5' 0.5" (1.537 m)  Wt 141 lb (63.957 kg)  BMI 27.07 kg/m2  LMP 12/15/2013   Discharge Condition: good  Disposition: 01-Home or Self Care  Discharge Instructions    Discharge activity:  No Restrictions    Complete by:  As directed      Discharge diet:  No restrictions    Complete by:  As directed      LABOR:  When conractions begin, you should start to time them from the beginning of one contraction to the beginning  of the next.  When contractions are 5 - 10 minutes apart or less and have  been regular for at least an hour, you should call your health care provider.    Complete by:  As directed      No sexual activity restrictions    Complete by:  As directed      Notify physician for bleeding from the vagina    Complete by:  As directed      Notify physician for blurring of vision or spots before the eyes    Complete by:  As directed      Notify physician for chills or fever    Complete by:  As directed      Notify physician for fainting spells, "black outs" or loss of consciousness    Complete by:  As directed      Notify physician for increase in vaginal discharge    Complete by:  As directed      Notify physician for leaking of fluid    Complete by:  As directed      Notify physician for pain or burning when urinating    Complete by:  As directed      Notify physician for pelvic pressure (sudden increase)    Complete by:  As  directed      Notify physician for severe or continued nausea or vomiting    Complete by:  As directed      Notify physician for sudden gushing of fluid from the vagina (with or without continued leaking)    Complete by:  As directed      Notify physician for sudden, constant, or occasional abdominal pain    Complete by:  As directed      Notify physician if baby moving less than usual    Complete by:  As directed             Medication List    TAKE these medications        acetaminophen 325 MG tablet  Commonly known as:  TYLENOL  Take 650 mg by mouth every 6 (six) hours as needed.           Follow-up Information    Follow up with [facility] In 4 days.   Why:  as previously scheduled   Contact information:   [address] [place] [address] [phone]      Signed: Willodean Rosenthal M.D. 09/01/2014, 1:41 PM

## 2014-09-01 NOTE — H&P (Signed)
LABOR ADMISSION HISTORY AND PHYSICAL  Lindsey Mccoy is a 29 y.o. female G3P1011 with IUP at [redacted]w[redacted]d by LMP c/w 18 week ultrasound presenting for external cephalic version. She has persistent breech presentation. She was found to have polyhydramnios on ultrasound yesterday with AFI of 26. She reports +FMs, No LOF, no VB, no blurry vision, headaches or peripheral edema, and RUQ pain.  Dating: By LMP consistent with [redacted]w[redacted]d ultrasound --->  Estimated Date of Delivery: 09/21/14  Sono:   , CWD, normal anatomy, breech presentation   Prenatal History/Complications:  Past Medical History: Past Medical History  Diagnosis Date  . Hypoglycemia   . Cyst of ovary, right   . Pregnant 04/14/2014    Past Surgical History: Past Surgical History  Procedure Laterality Date  . No past surgeries      Obstetrical History: OB History    Gravida Para Term Preterm AB TAB SAB Ectopic Multiple Living   0 1 0 1 0 0 1      Social History: History   Social History  . Marital Status: Single    Spouse Name: N/A  . Number of Children: N/A  . Years of Education: N/A   Social History Main Topics  . Smoking status: Current Every Day Smoker -- 0.50 packs/day    Types: Cigarettes  . Smokeless tobacco: Never Used  . Alcohol Use: No  . Drug Use: No  . Sexual Activity: Yes    Birth Control/ Protection: None   Other Topics Concern  . Not on file   Social History Narrative    Family History: Family History  Problem Relation Age of Onset  . Heart disease Paternal Grandfather   . Cancer Paternal Grandmother     breast  . Asthma Maternal Grandmother   . Diabetes Father   . Hypertension Father   . Migraines Mother   . Pancreatitis Brother     Allergies: Allergies  Allergen Reactions  . Mucinex [Guaifenesin Er]     "closes up air ways"  . Penicillins Hives    "tongue swells up"  . Percocet [Oxycodone-Acetaminophen] Hives and Nausea And Vomiting    Prescriptions prior to  admission  Medication Sig Dispense Refill Last Dose  . acetaminophen (TYLENOL) 325 MG tablet Take 650 mg by mouth every 6 (six) hours as needed.   08/31/2014 at Unknown time     Review of Systems   All systems reviewed and negative except as stated in HPI  Blood pressure 114/71, pulse 89, temperature 98.2 F (36.8 C), temperature source Oral, resp. rate 18, height 5' 0.5" (1.537 m), weight 63.957 kg (141 lb), last menstrual period 12/15/2013. General appearance: alert and no distress Abdomen: soft, non-tender; bowel sounds normal Presentation: breech confirmed by bedside sono Fetal monitoringBaseline: 145 bpm with moderate variability, accelerations present Uterine activityNone     Prenatal labs: ABO, Rh: O/--/-- (02/04 1223) Antibody: Negative (03/31 0913) Rubella:   RPR: Non Reactive (03/31 0913)  HBsAg: Negative (02/04 1223)  HIV: Non-reactive (03/31 0000)  GBS:    2 hr Glucola normal, 85/161/126 Genetic screening  AFP normal Anatomy US normal  Prenatal Transfer Tool  Maternal Diabetes: No Genetic Screening: Normal Maternal Ultrasounds/Referrals: Normal Fetal Ultrasounds or other Referrals:  None Maternal Substance Abuse:  No Significant Maternal Medications:  None Significant Maternal Lab Results: None  Results for orders placed or performed in visit on 08/31/14 (from the past 24 hour(s))  POCT urinalysis dipstick   Collection Time: 08/31/14  1:08 PM  Result Value Ref Range   Color, UA     Clarity, UA     Glucose, UA neg    Bilirubin, UA     Ketones, UA neg    Spec Grav, UA     Blood, UA neg    pH, UA     Protein, UA neg    Urobilinogen, UA     Nitrite, UA neg    Leukocytes, UA Negative   Results for orders placed or performed in visit on 08/31/14 (from the past 24 hour(s))  US OB Follow Up   Collection Time: 08/31/14 12:16 PM  Result Value Ref Range   Biparietal Diameter  cm   Abdominal Circumference  cm   Femoral Diameter  cm   Head Circumference   cm   HC/AC     Estimated Fetal Weight  grams    Patient Active Problem List   Diagnosis Date Noted  . Polyhydramnios in third trimester, antepartum 08/31/2014  . Late prenatal care in second trimester 04/28/2014  . Supervision of other high-risk pregnancy 04/14/2014    Assessment: Lindsey Mccoy is a 29 y.o. G3P1011 at [redacted]w[redacted]d here for external cephalic version with persistent breech presentation and polyhydramnios with AFI of 26 on 08/31/14.  Breech presentation confirmed by bedside sono Cephalic version to be completed by Dr. Erin Fulling with terbutaline Continuous fetal monitoring.      Fabio Asa 09/01/2014, 10:45 AM

## 2014-09-02 LAB — STREP GP B NAA+RFLX: STREP GP B NAA+RFLX: NEGATIVE

## 2014-09-02 LAB — GC/CHLAMYDIA PROBE AMP
CHLAMYDIA, DNA PROBE: NEGATIVE
NEISSERIA GONORRHOEAE BY PCR: NEGATIVE

## 2014-09-05 ENCOUNTER — Ambulatory Visit (INDEPENDENT_AMBULATORY_CARE_PROVIDER_SITE_OTHER): Payer: Medicaid Other | Admitting: Obstetrics & Gynecology

## 2014-09-05 ENCOUNTER — Encounter: Payer: Self-pay | Admitting: Obstetrics & Gynecology

## 2014-09-05 VITALS — BP 120/60 | Wt 144.0 lb

## 2014-09-05 DIAGNOSIS — O09893 Supervision of other high risk pregnancies, third trimester: Secondary | ICD-10-CM

## 2014-09-05 DIAGNOSIS — Z3A37 37 weeks gestation of pregnancy: Secondary | ICD-10-CM

## 2014-09-05 DIAGNOSIS — Z331 Pregnant state, incidental: Secondary | ICD-10-CM

## 2014-09-05 DIAGNOSIS — Z1389 Encounter for screening for other disorder: Secondary | ICD-10-CM

## 2014-09-05 DIAGNOSIS — O403XX1 Polyhydramnios, third trimester, fetus 1: Secondary | ICD-10-CM | POA: Diagnosis not present

## 2014-09-05 LAB — POCT URINALYSIS DIPSTICK
Blood, UA: NEGATIVE
GLUCOSE UA: NEGATIVE
Ketones, UA: NEGATIVE
Leukocytes, UA: NEGATIVE
NITRITE UA: NEGATIVE
Protein, UA: NEGATIVE

## 2014-09-05 NOTE — Progress Notes (Signed)
Fetal Surveillance Testing today:  Reactive NST   High Risk Pregnancy Diagnosis(es):   Polyhydramnios,mild  G3P1011 [redacted]w[redacted]d Estimated Date of Delivery: 09/21/14  Blood pressure 120/60, weight 144 lb (65.318 kg), last menstrual period 12/15/2013.  Urinalysis: Negative   HPI: The patient is being seen today for ongoing management of polyhydramnios. Today she reports back pain   BP weight and urine results all reviewed and noted. Patient reports good fetal movement, denies any bleeding and no rupture of membranes symptoms or regular contractions.  Fundal Height:  37 Fetal Heart rate:  140 Edema:  none  Patient is without complaints other than noted in her HPI. All questions were answered.  All lab and sonogram results have been reviewed. Comments: abnormal: poly   Assessment:  1.  Pregnancy at [redacted]w[redacted]d,  Estimated Date of Delivery: 09/21/14 :                          2.  polyhydramnios                        3.    Medication(s) Plans:    Treatment Plan:  Twice weekly surveillance, indcution 39 weeks  Follow up in 3 days weeks for appointment for high risk OB care, NST

## 2014-09-08 ENCOUNTER — Encounter: Payer: Self-pay | Admitting: Obstetrics & Gynecology

## 2014-09-08 ENCOUNTER — Ambulatory Visit (INDEPENDENT_AMBULATORY_CARE_PROVIDER_SITE_OTHER): Payer: Medicaid Other | Admitting: Obstetrics & Gynecology

## 2014-09-08 VITALS — BP 110/70 | HR 88 | Wt 143.0 lb

## 2014-09-08 DIAGNOSIS — O09893 Supervision of other high risk pregnancies, third trimester: Secondary | ICD-10-CM

## 2014-09-08 DIAGNOSIS — Z1389 Encounter for screening for other disorder: Secondary | ICD-10-CM

## 2014-09-08 DIAGNOSIS — Z331 Pregnant state, incidental: Secondary | ICD-10-CM

## 2014-09-08 DIAGNOSIS — O403XX1 Polyhydramnios, third trimester, fetus 1: Secondary | ICD-10-CM

## 2014-09-08 LAB — POCT URINALYSIS DIPSTICK
GLUCOSE UA: NEGATIVE
KETONES UA: NEGATIVE
Nitrite, UA: NEGATIVE
Protein, UA: NEGATIVE
RBC UA: NEGATIVE

## 2014-09-08 NOTE — Progress Notes (Signed)
Fetal Surveillance Testing today:  Reactive NST(pt statement noted, she states he is still moving but just not as much, kick counts discussed perform in the evening 10 per 2 hours)   High Risk Pregnancy Diagnosis(es):   Polyhydramnios, idiopathic, mild  G3P1011 [redacted]w[redacted]d Estimated Date of Delivery: 09/21/14  Blood pressure 110/70, pulse 88, weight 143 lb (64.864 kg), last menstrual period 12/15/2013.  Urinalysis: Negative   HPI: The patient is being seen today for ongoing management of polyhydramnios, idiopathic. Today she reports as above   BP weight and urine results all reviewed and noted. Patient reports good fetal movement, denies any bleeding and no rupture of membranes symptoms or regular contractions.  Fundal Height:  39  Fetal Heart rate:  140 Edema:  none  Patient is without complaints other than noted in her HPI. All questions were answered.  All lab and sonogram results have been reviewed. Comments: abnormal: polyhydramnios   Assessment:  1.  Pregnancy at [redacted]w[redacted]d,  Estimated Date of Delivery: 09/21/14 :                          2.  Polyhydramnios, idiopathic, mild                        3.    Medication(s) Plans:  No changes  Treatment Plan:  Twice weekly surveilloance  Follow up in monday weeks for appointment for high risk OB care, NST

## 2014-09-12 ENCOUNTER — Encounter: Payer: Self-pay | Admitting: Obstetrics and Gynecology

## 2014-09-12 ENCOUNTER — Telehealth (HOSPITAL_COMMUNITY): Payer: Self-pay | Admitting: *Deleted

## 2014-09-12 ENCOUNTER — Ambulatory Visit (INDEPENDENT_AMBULATORY_CARE_PROVIDER_SITE_OTHER): Payer: Medicaid Other | Admitting: Obstetrics and Gynecology

## 2014-09-12 DIAGNOSIS — Z331 Pregnant state, incidental: Secondary | ICD-10-CM

## 2014-09-12 DIAGNOSIS — O09893 Supervision of other high risk pregnancies, third trimester: Secondary | ICD-10-CM

## 2014-09-12 DIAGNOSIS — Z1389 Encounter for screening for other disorder: Secondary | ICD-10-CM

## 2014-09-12 DIAGNOSIS — Z3A38 38 weeks gestation of pregnancy: Secondary | ICD-10-CM

## 2014-09-12 DIAGNOSIS — O403XX1 Polyhydramnios, third trimester, fetus 1: Secondary | ICD-10-CM

## 2014-09-12 LAB — POCT URINALYSIS DIPSTICK
Blood, UA: NEGATIVE
Glucose, UA: NEGATIVE
Ketones, UA: NEGATIVE
LEUKOCYTES UA: NEGATIVE
Nitrite, UA: NEGATIVE
PROTEIN UA: NEGATIVE

## 2014-09-12 NOTE — Progress Notes (Signed)
Pt denies any problems or concerns at this time.  

## 2014-09-12 NOTE — Telephone Encounter (Signed)
Preadmission screen  

## 2014-09-12 NOTE — Progress Notes (Signed)
Fetal Surveillance Testing today:  NST for polyhydramnios. Pt is recently s/p Ext Version to convert to vertex, successful 6/9.   High Risk Pregnancy Diagnosis(es):   Polyhydramnios, idiopathic                                                                Resolved Breech, successfulECV  G3P1011 [redacted]w[redacted]d Estimated Date of Delivery: 09/21/14  Blood pressure 120/80, pulse 84, weight 142 lb (64.411 kg), last menstrual period 12/15/2013.  Urinalysis: Negative   HPI: The patient is being seen today for ongoing management of polyhydramnios, idiopathic. Today she reports good FM, no bleeding, contractions irregular BP weight and urine results all reviewed and noted. Patient reports good fetal movement, denies any bleeding and no rupture of membranes symptoms or regular contractions.  Fundal Height:  36 EFW 7 lb Fetal Heart rate:  145 Edema:  none  Patient is without complaints other than noted in her HPI. All questions were answered.  All lab and sonogram results have been reviewed. Comments: gbs neg  HIV neg GC/ CHL neg  Assessment:  1.  Pregnancy at [redacted]w[redacted]d,  Estimated Date of Delivery: 09/21/14 :            POLYHYDRAMNIOS, NOW IMPROVED                        2.  NORMAL GTT                        3.  GBS NEGATIVE WITH HX GBS + WITH PRIOR PREGNANCY  Medication(s) Plans:  NONE  Treatment Plan:  IOL AFTER 39 WK  Follow up in 3 DAYS  for appointment for  IOL , PT [place] [facility] tHURSDAY AM

## 2014-09-14 LAB — US OB FOLLOW UP

## 2014-09-15 ENCOUNTER — Inpatient Hospital Stay (HOSPITAL_COMMUNITY)
Admission: RE | Admit: 2014-09-15 | Discharge: 2014-09-17 | DRG: 775 | Disposition: A | Discharge status: Home / Self Care | Payer: Medicaid Other | Source: Ambulatory Visit | Attending: Family Medicine | Admitting: Family Medicine

## 2014-09-15 ENCOUNTER — Inpatient Hospital Stay (HOSPITAL_COMMUNITY): Payer: Medicaid Other | Admitting: Anesthesiology

## 2014-09-15 ENCOUNTER — Encounter (HOSPITAL_COMMUNITY)
Admission: RE | Disposition: A | Discharge status: Home / Self Care | Payer: Self-pay | Source: Ambulatory Visit | Attending: Family Medicine

## 2014-09-15 ENCOUNTER — Encounter (HOSPITAL_COMMUNITY): Payer: Self-pay

## 2014-09-15 VITALS — BP 133/70 | HR 90 | Temp 97.9°F | Resp 19 | Ht 60.5 in | Wt 142.0 lb

## 2014-09-15 DIAGNOSIS — K219 Gastro-esophageal reflux disease without esophagitis: Secondary | ICD-10-CM | POA: Diagnosis present

## 2014-09-15 DIAGNOSIS — Z8249 Family history of ischemic heart disease and other diseases of the circulatory system: Secondary | ICD-10-CM | POA: Diagnosis not present

## 2014-09-15 DIAGNOSIS — O99334 Smoking (tobacco) complicating childbirth: Secondary | ICD-10-CM | POA: Diagnosis present

## 2014-09-15 DIAGNOSIS — F17210 Nicotine dependence, cigarettes, uncomplicated: Secondary | ICD-10-CM | POA: Diagnosis present

## 2014-09-15 DIAGNOSIS — Z833 Family history of diabetes mellitus: Secondary | ICD-10-CM | POA: Diagnosis not present

## 2014-09-15 DIAGNOSIS — O9962 Diseases of the digestive system complicating childbirth: Secondary | ICD-10-CM | POA: Diagnosis present

## 2014-09-15 DIAGNOSIS — O0932 Supervision of pregnancy with insufficient antenatal care, second trimester: Secondary | ICD-10-CM

## 2014-09-15 DIAGNOSIS — O09893 Supervision of other high risk pregnancies, third trimester: Secondary | ICD-10-CM

## 2014-09-15 DIAGNOSIS — Z3A39 39 weeks gestation of pregnancy: Secondary | ICD-10-CM | POA: Diagnosis present

## 2014-09-15 DIAGNOSIS — O403XX0 Polyhydramnios, third trimester, not applicable or unspecified: Principal | ICD-10-CM | POA: Diagnosis present

## 2014-09-15 DIAGNOSIS — O321XX0 Maternal care for breech presentation, not applicable or unspecified: Secondary | ICD-10-CM | POA: Diagnosis present

## 2014-09-15 DIAGNOSIS — O321XX1 Maternal care for breech presentation, fetus 1: Secondary | ICD-10-CM | POA: Diagnosis not present

## 2014-09-15 HISTORY — DX: Adverse effect of unspecified anesthetic, initial encounter: T41.45XA

## 2014-09-15 HISTORY — DX: Other complications of anesthesia, initial encounter: T88.59XA

## 2014-09-15 LAB — CBC
HEMATOCRIT: 34.8 % — AB (ref 36.0–46.0)
Hemoglobin: 12 g/dL (ref 12.0–15.0)
MCH: 32.2 pg (ref 26.0–34.0)
MCHC: 34.5 g/dL (ref 30.0–36.0)
MCV: 93.3 fL (ref 78.0–100.0)
Platelets: 236 10*3/uL (ref 150–400)
RBC: 3.73 MIL/uL — ABNORMAL LOW (ref 3.87–5.11)
RDW: 14.3 % (ref 11.5–15.5)
WBC: 18.2 10*3/uL — AB (ref 4.0–10.5)

## 2014-09-15 LAB — ABO/RH: ABO/RH(D): O POS

## 2014-09-15 LAB — TYPE AND SCREEN
ABO/RH(D): O POS
ANTIBODY SCREEN: NEGATIVE

## 2014-09-15 LAB — RPR: RPR Ser Ql: NONREACTIVE

## 2014-09-15 SURGERY — Surgical Case
Anesthesia: Regional

## 2014-09-15 MED ORDER — PHENYLEPHRINE 40 MCG/ML (10ML) SYRINGE FOR IV PUSH (FOR BLOOD PRESSURE SUPPORT)
80.0000 ug | PREFILLED_SYRINGE | INTRAVENOUS | Status: DC | PRN
  Filled 2014-09-15: qty 2
  Filled 2014-09-15: qty 20

## 2014-09-15 MED ORDER — OXYTOCIN 40 UNITS IN LACTATED RINGERS INFUSION - SIMPLE MED
62.5000 mL/h | INTRAVENOUS | Status: DC
  Administered 2014-09-16: 62.5 mL/h via INTRAVENOUS
  Filled 2014-09-15: qty 1000

## 2014-09-15 MED ORDER — LIDOCAINE HCL (PF) 1 % IJ SOLN
30.0000 mL | INTRAMUSCULAR | Status: DC | PRN
  Filled 2014-09-15: qty 30

## 2014-09-15 MED ORDER — DIPHENHYDRAMINE HCL 50 MG/ML IJ SOLN: 12.5000 mg | INTRAMUSCULAR | Status: DC | PRN

## 2014-09-15 MED ORDER — OXYTOCIN 40 UNITS IN LACTATED RINGERS INFUSION - SIMPLE MED: 1.0000 m[IU]/min | INTRAVENOUS | Status: DC

## 2014-09-15 MED ORDER — ACETAMINOPHEN 325 MG PO TABS
650.0000 mg | ORAL_TABLET | ORAL | Status: DC | PRN
  Administered 2014-09-15: 650 mg via ORAL
  Filled 2014-09-15: qty 2

## 2014-09-15 MED ORDER — PHENYLEPHRINE 40 MCG/ML (10ML) SYRINGE FOR IV PUSH (FOR BLOOD PRESSURE SUPPORT): 80.0000 ug | PREFILLED_SYRINGE | INTRAVENOUS | Status: DC | PRN

## 2014-09-15 MED ORDER — MISOPROSTOL 25 MCG QUARTER TABLET
25.0000 ug | ORAL_TABLET | ORAL | Status: DC
  Administered 2014-09-15: 25 ug via VAGINAL
  Filled 2014-09-15: qty 0.25
  Filled 2014-09-15: qty 1

## 2014-09-15 MED ORDER — LIDOCAINE HCL (PF) 1 % IJ SOLN
INTRAMUSCULAR | Status: DC | PRN
  Administered 2014-09-15: 4 mL
  Administered 2014-09-15: 3 mL

## 2014-09-15 MED ORDER — LACTATED RINGERS IV SOLN
INTRAVENOUS | Status: DC
  Administered 2014-09-15 – 2014-09-16 (×2): via INTRAVENOUS

## 2014-09-15 MED ORDER — FENTANYL CITRATE (PF) 100 MCG/2ML IJ SOLN: 100.0000 ug | INTRAMUSCULAR | Status: DC | PRN

## 2014-09-15 MED ORDER — NICOTINE 21 MG/24HR TD PT24
21.0000 mg | MEDICATED_PATCH | Freq: Every day | TRANSDERMAL | Status: DC
  Administered 2014-09-15: 21 mg via TRANSDERMAL
  Filled 2014-09-15 (×2): qty 1

## 2014-09-15 MED ORDER — ONDANSETRON HCL 4 MG/2ML IJ SOLN
4.0000 mg | Freq: Four times a day (QID) | INTRAMUSCULAR | Status: DC | PRN
  Administered 2014-09-16: 4 mg via INTRAVENOUS
  Filled 2014-09-15: qty 2

## 2014-09-15 MED ORDER — TERBUTALINE SULFATE 1 MG/ML IJ SOLN
0.2500 mg | Freq: Once | INTRAMUSCULAR | Status: AC | PRN
  Administered 2014-09-15: 0.25 mg via SUBCUTANEOUS
  Filled 2014-09-15: qty 1

## 2014-09-15 MED ORDER — ZOLPIDEM TARTRATE 5 MG PO TABS: 5.0000 mg | ORAL_TABLET | Freq: Every evening | ORAL | Status: DC | PRN

## 2014-09-15 MED ORDER — FENTANYL 2.5 MCG/ML BUPIVACAINE 1/10 % EPIDURAL INFUSION (WH - ANES)
12.0000 mL/h | INTRAMUSCULAR | Status: DC | PRN
Start: 2014-09-15 — End: 2014-09-15
  Administered 2014-09-15: 12 mL/h via EPIDURAL
  Filled 2014-09-15: qty 125

## 2014-09-15 MED ORDER — CITRIC ACID-SODIUM CITRATE 334-500 MG/5ML PO SOLN: 30.0000 mL | ORAL | Status: DC | PRN

## 2014-09-15 MED ORDER — FENTANYL 2.5 MCG/ML BUPIVACAINE 1/10 % EPIDURAL INFUSION (WH - ANES)
12.0000 mL/h | INTRAMUSCULAR | Status: DC | PRN
  Administered 2014-09-16: 12 mL/h via EPIDURAL
  Filled 2014-09-15: qty 125

## 2014-09-15 MED ORDER — LACTATED RINGERS IV SOLN: 500.0000 mL | INTRAVENOUS | Status: DC | PRN

## 2014-09-15 MED ORDER — OXYTOCIN BOLUS FROM INFUSION
500.0000 mL | INTRAVENOUS | Status: DC
  Administered 2014-09-16: 500 mL via INTRAVENOUS

## 2014-09-15 MED ORDER — OXYTOCIN 40 UNITS IN LACTATED RINGERS INFUSION - SIMPLE MED
2.0000 m[IU]/min | INTRAVENOUS | Status: DC
  Administered 2014-09-15: 2 m[IU]/min via INTRAVENOUS

## 2014-09-15 NOTE — Progress Notes (Signed)
Patient ID: Lindsey Mccoy, female   DOB: 1985/09/19, 29 y.o.   MRN: [id number] After informed verbal consent, Terbutaline 0.25 mg SQ given, ECV was attempted under Ultrasound guidance.  Baby was breech. Forward roll to vertex.   FHR was reactive before and after the procedure.   Pt. Tolerated the procedure well. AROM with copious amount of clear fluid. Abdominal binder placed. Head well applied. VE 5-6 cm/70/-2 Begin Pitocin.

## 2014-09-15 NOTE — Anesthesia Preprocedure Evaluation (Signed)
Anesthesia Evaluation  Patient identified by MRN, date of birth, ID band Patient awake    Reviewed: Allergy & Precautions, NPO status , Patient's Chart, lab work & pertinent test results  History of Anesthesia Complications (+) history of anesthetic complications  Airway Mallampati: II  TM Distance: >3 FB Neck ROM: Full    Dental no notable dental hx. (+) Teeth Intact   Pulmonary Current Smoker,  breath sounds clear to auscultation  Pulmonary exam normal       Cardiovascular negative cardio ROS Normal cardiovascular examRhythm:Regular Rate:Normal     Neuro/Psych negative neurological ROS  negative psych ROS   GI/Hepatic Neg liver ROS, GERD-  ,  Endo/Other  negative endocrine ROS  Renal/GU negative Renal ROS  negative genitourinary   Musculoskeletal negative musculoskeletal ROS (+)   Abdominal   Peds  Hematology  (+) anemia ,   Anesthesia Other Findings   Reproductive/Obstetrics (+) Pregnancy Breech presentation                             Anesthesia Physical  Anesthesia Plan  ASA: II  Anesthesia Plan: Epidural   Post-op Pain Management:    Induction:   Airway Management Planned: Natural Airway  Additional Equipment:   Intra-op Plan:   Post-operative Plan:   Informed Consent: I have reviewed the patients History and Physical, chart, labs and discussed the procedure including the risks, benefits and alternatives for the proposed anesthesia with the patient or authorized representative who has indicated his/her understanding and acceptance.     Plan Discussed with: Anesthesiologist and Surgeon  Anesthesia Plan Comments:         Anesthesia Quick Evaluation

## 2014-09-15 NOTE — Progress Notes (Signed)
Patient ID: Lindsey Mccoy, female   DOB: 1985/09/24, 29 y.o.   MRN: [id number] Discussed situation with patient. I have offered her epidural followed by ECV, with SROM and an abdominal binder.  She desires this.  Will perform, after she goes for a walk.

## 2014-09-15 NOTE — Progress Notes (Signed)
S:  Patient resting comfortably with no complaints of pain.     O:  VS: Blood pressure 108/62, pulse 82, temperature 98.2 F (36.8 C), temperature source Oral, resp. rate 18, height 5' 0.5" (1.537 m), weight 64.411 kg (142 lb), last menstrual period 12/15/2013, SpO2 96 %.        FHR : baseline 140 / variability moderate / accelerations present / no decelerations        Toco: contractions every 3-5 min        Cervix : 6-7 cm, thick. Station is -2        Membranes: AROM at 2046         A: G3 P1011 in active labor s/p successful version at 2046 on 6/23.      P: IUPC inserted and continue pitocin Anticipate NSVD   Lindsey Mccoy SNM 09/15/2014, 11:54 PM   I have seen and examined this patient and agree the above assessment. CRESENZO-DISHMAN,Raesha Coonrod 09/16/2014 6:18 AM

## 2014-09-15 NOTE — Anesthesia Procedure Notes (Addendum)
Procedures

## 2014-09-15 NOTE — Progress Notes (Signed)
Lindsey Mccoy is a 29 y.o. G3P1011 at [redacted]w[redacted]d by LMP admitted for induction of labor due to polyHydramnios.  Subjective:Pt had foley bulb placed earlier this morning, removed at middday.  Pt not currently contracting. Ultrasound is done by me prior to considering oxytocin. Pt is now breech, Patient had external version earlier in the pregnancy , which was successful, but is now NOT interested in repeat ecv due to the intensity of discomfort of prior version. Pt options discussed, and she is desirous of cesarean delivery.  She last ate lunch at 2 pm(completed) so she would be planned for cesarean at 10 pm.   Risks, rationale, discussed and procedure consented. Pt has Type and Screen on order.   Objective: BP 102/63 mmHg  Pulse 94  Temp(Src) 98.3 F (36.8 C) (Oral)  Resp 20  Ht 5' 0.5" (1.537 m)  Wt 142 lb (64.411 kg)  BMI 27.27 kg/m2  LMP 12/15/2013      FHT:  FHR: 145 bpm, variability: moderate,  accelerations:  Present,  decelerations:  Absent UC:   none SVE:   Dilation: 4.5 Effacement (%): 70 Station: -3 Exam by:: [employer]: Lab Results  Component Value Date   WBC 18.2* 09/15/2014   HGB 12.0 09/15/2014   HCT 34.8* 09/15/2014   MCV 93.3 09/15/2014   PLT 236 09/15/2014    Assessment / Plan: pregnancy 39 wk polyhydramnios, Breech presentation, declining external version.  Labor: none Preeclampsia:   Fetal Wellbeing:  Category I Pain Control:  . I/D:  n/a Anticipated MOD:  cesarean   Lindsey Mccoy V 09/15/2014, 4:41 PM

## 2014-09-15 NOTE — Anesthesia Preprocedure Evaluation (Signed)
Anesthesia Evaluation  Patient identified by MRN, date of birth, ID band Patient awake    Reviewed: Allergy & Precautions, NPO status , Patient's Chart, lab work & pertinent test results  History of Anesthesia Complications (+) history of anesthetic complications  Airway Mallampati: II  TM Distance: >3 FB Neck ROM: Full    Dental no notable dental hx. (+) Teeth Intact   Pulmonary Current Smoker,  breath sounds clear to auscultation  Pulmonary exam normal       Cardiovascular negative cardio ROS Normal cardiovascular examRhythm:Regular Rate:Normal     Neuro/Psych negative neurological ROS  negative psych ROS   GI/Hepatic Neg liver ROS, GERD-  ,  Endo/Other  negative endocrine ROS  Renal/GU negative Renal ROS  negative genitourinary   Musculoskeletal negative musculoskeletal ROS (+)   Abdominal   Peds  Hematology  (+) anemia ,   Anesthesia Other Findings   Reproductive/Obstetrics (+) Pregnancy Breech presentation                             Anesthesia Physical  Anesthesia Plan  ASA: II  Anesthesia Plan: Epidural   Post-op Pain Management:    Induction:   Airway Management Planned: Natural Airway  Additional Equipment:   Intra-op Plan:   Post-operative Plan:   Informed Consent: I have reviewed the patients History and Physical, chart, labs and discussed the procedure including the risks, benefits and alternatives for the proposed anesthesia with the patient or authorized representative who has indicated his/her understanding and acceptance.     Plan Discussed with: Anesthesiologist and Surgeon  Anesthesia Plan Comments:         Anesthesia Quick Evaluation  

## 2014-09-15 NOTE — Progress Notes (Signed)
Pt off monitor to walk, will be put back on monitor by [phone]

## 2014-09-15 NOTE — H&P (Signed)
Lindsey Mccoy is a 29 y.o. female G3P1011 @ 39.1wks by LMP and confirmed by 18wk scan presenting for IOL due to polyhydramnios. Her preg has been followed by the [facility] service since 18wks and has been remarkable for 1) poly dx @ 37wks w/ AFI 26cm 2) pt's nephew w/ genetic microcephaly, linked to pt's mother 3) smoker >1ppd 4) GBS neg 5) s/p ECV from breech on 6/9.  History OB History    Gravida Para Term Preterm AB TAB SAB Ectopic Multiple Living   3 1 1  0 1 0 1 0 0 1     Past Medical History  Diagnosis Date  . Hypoglycemia   . Cyst of ovary, right   . Pregnant 04/14/2014  . Complication of anesthesia    Past Surgical History  Procedure Laterality Date  . No past surgeries     Family History: family history includes Asthma in her maternal grandmother; Cancer in her paternal grandmother; Diabetes in her father; Heart disease in her paternal grandfather; Hypertension in her father; Migraines in her mother; Pancreatitis in her brother. Social History:  reports that she has been smoking Cigarettes.  She has been smoking about 0.50 packs per day. She has never used smokeless tobacco. She reports that she does not drink alcohol or use illicit drugs.   Prenatal Transfer Tool  Maternal Diabetes: No Genetic Screening: Normal Maternal Ultrasounds/Referrals: Abnormal:  Findings:   Other:Poly Fetal Ultrasounds or other Referrals:  None Maternal Substance Abuse:  Yes:  Type: Smoker Significant Maternal Medications:  None Significant Maternal Lab Results:  Lab values include: Group B Strep negative Other Comments:  sister's son w/ genetic microcephaly linked to pt's mother  ROS  Dilation: 2 Effacement (%): 50 Station: Ballotable Exam by:: Pincus Badder CNM Blood pressure 93/67, pulse 98, temperature 98.4 F (36.9 C), temperature source Oral, resp. rate 18, height 5' 0.5" (1.537 m), weight 64.411 kg (142 lb), last menstrual period 12/15/2013. Exam Physical Exam  Constitutional: She is  oriented to person, place, and time. She appears well-developed.  HENT:  Head: Normocephalic.  Neck: Normal range of motion.  Cardiovascular: Normal rate.   Respiratory: Effort normal.  GI:  EFM 120s, +accels, no decels, occ mi variables Irreg mild ctx q 2-6 mins  Genitourinary: Vagina normal.  Cx 2/50/-2, intact  Musculoskeletal: Normal range of motion.  Neurological: She is alert and oriented to person, place, and time.  Skin: Skin is warm and dry.  Psychiatric: She has a normal mood and affect. Her behavior is normal. Thought content normal.    Prenatal labs: ABO, Rh: O/--/-- (02/04 1223) Antibody: Negative (03/31 0913) Rubella: Immune (02/04 0000) RPR: Non Reactive (03/31 0913)  HBsAg: Negative (02/04 1223)  HIV: Non-reactive (03/31 0000)  GBS: Negative (06/09 0000)   Assessment/Plan: IUP@39 .1wks Polyhydramnios GBS neg  Admit to [facility] for IOL Will use cx ripening followed by Pitocin   Persephonie Hegwood, Maury CNM 09/15/2014, 3:47 AM

## 2014-09-15 NOTE — Progress Notes (Signed)
Pt off monitor to eat and walk, will be put back on monitor in one hour; pt told to hit the call bell when back in room, to be back by 1505. Told pt to come back if water breaks or if she is experiencing sudden severe pain; pt verbalized understanding.

## 2014-09-15 NOTE — Anesthesia Procedure Notes (Signed)
Epidural Patient location during procedure: OB Start time: 09/15/2014 7:49 PM  Staffing Anesthesiologist: Mal Amabile Performed by: anesthesiologist   Preanesthetic Checklist Completed: patient identified, site marked, surgical consent, pre-op evaluation, timeout performed, IV checked, risks and benefits discussed and monitors and equipment checked  Epidural Patient position: sitting Prep: site prepped and draped and DuraPrep Patient monitoring: continuous pulse ox and blood pressure Approach: midline Location: L3-L4 Injection technique: LOR air  Needle:  Needle type: Tuohy  Needle gauge: 17 G Needle length: 9 cm and 9 Needle insertion depth: 5 cm cm Catheter type: closed end flexible Catheter size: 19 Gauge Catheter at skin depth: 10 cm Test dose: negative and Other  Assessment Events: blood not aspirated, injection not painful, no injection resistance, negative IV test and no paresthesia  Additional Notes Patient identified. Risks and benefits discussed including failed block, incomplete  Pain control, post dural puncture headache, nerve damage, paralysis, blood pressure Changes, nausea, vomiting, reactions to medications-both toxic and allergic and post Partum back pain. All questions were answered. Patient expressed understanding and wished to proceed. Sterile technique was used throughout procedure. Epidural site was Dressed with sterile barrier dressing. No paresthesias, signs of intravascular injection Or signs of intrathecal spread were encountered.  Patient was more comfortable after the epidural was dosed. Please see RN's note for documentation of vital signs and FHR which are stable.

## 2014-09-15 NOTE — Progress Notes (Signed)
Labor Progress Note  Vickiana Heiges Taflinger is a 29 y.o. G3P1011 at [redacted]w[redacted]d admitted for induction of labor due to Polyhydramnios.  S: Doing well. Stating she has some bloody show but FB not out yet. Requesting to go out and smoke.  O:  BP 102/63 mmHg  Pulse 94  Temp(Src) 98.3 F (36.8 C) (Oral)  Resp 20  Ht 5' 0.5" (1.537 m)  Wt 142 lb (64.411 kg)  BMI 27.27 kg/m2  LMP 12/15/2013 FHT:  FHR: 125 bpm, variability: moderate,  accelerations:  Present,  decelerations:  Absent UC:   regular, every 2-5 minutes SVE:   Dilation: 4.5 Effacement (%): 70 Station: -3 Exam by:: Dr Doroteo Glassman  Labs: Lab Results  Component Value Date   WBC 18.2* 09/15/2014   HGB 12.0 09/15/2014   HCT 34.8* 09/15/2014   MCV 93.3 09/15/2014   PLT 236 09/15/2014    Assessment / Plan: 29 y.o. G3P1011 [redacted]w[redacted]d in early labor Induction of labor due to polyhydraminos  Labor: Progressing normally, FB now out and s/p cytotec x1. Plan to augment with Pit in about an hour so patient can eat.  Fetal Wellbeing:  Category I Pain Control:  Labor support without medications Anticipated MOD:  NSVD  Discussed with patient that it is against medical advice that she go smoke. She has nicotine patch.   Expectant management   Caryl Ada, DO 09/15/2014, 2:22 PM PGY-1, [facility]

## 2014-09-16 ENCOUNTER — Encounter (HOSPITAL_COMMUNITY): Payer: Self-pay

## 2014-09-16 DIAGNOSIS — O403XX0 Polyhydramnios, third trimester, not applicable or unspecified: Secondary | ICD-10-CM

## 2014-09-16 DIAGNOSIS — O321XX0 Maternal care for breech presentation, not applicable or unspecified: Secondary | ICD-10-CM

## 2014-09-16 DIAGNOSIS — O99334 Smoking (tobacco) complicating childbirth: Secondary | ICD-10-CM

## 2014-09-16 DIAGNOSIS — O9962 Diseases of the digestive system complicating childbirth: Secondary | ICD-10-CM

## 2014-09-16 DIAGNOSIS — Z3A39 39 weeks gestation of pregnancy: Secondary | ICD-10-CM

## 2014-09-16 DIAGNOSIS — F17210 Nicotine dependence, cigarettes, uncomplicated: Secondary | ICD-10-CM

## 2014-09-16 MED ORDER — SENNOSIDES-DOCUSATE SODIUM 8.6-50 MG PO TABS
2.0000 | ORAL_TABLET | ORAL | Status: DC
  Administered 2014-09-16: 2 via ORAL
  Filled 2014-09-16: qty 2

## 2014-09-16 MED ORDER — DIBUCAINE 1 % RE OINT: 1.0000 "application " | TOPICAL_OINTMENT | RECTAL | Status: DC | PRN

## 2014-09-16 MED ORDER — METHYLERGONOVINE MALEATE 0.2 MG/ML IJ SOLN: 0.2000 mg | INTRAMUSCULAR | Status: DC | PRN

## 2014-09-16 MED ORDER — FERROUS SULFATE 325 (65 FE) MG PO TABS
325.0000 mg | ORAL_TABLET | Freq: Two times a day (BID) | ORAL | Status: DC
  Administered 2014-09-16 – 2014-09-17 (×2): 325 mg via ORAL
  Filled 2014-09-16 (×2): qty 1

## 2014-09-16 MED ORDER — ACETAMINOPHEN 325 MG PO TABS: 650.0000 mg | ORAL_TABLET | Freq: Four times a day (QID) | ORAL | Status: DC | PRN

## 2014-09-16 MED ORDER — WITCH HAZEL-GLYCERIN EX PADS
1.0000 | MEDICATED_PAD | CUTANEOUS | Status: DC | PRN
Start: 2014-09-16 — End: 2014-09-17
  Administered 2014-09-16: 1 via TOPICAL

## 2014-09-16 MED ORDER — BISACODYL 10 MG RE SUPP: 10.0000 mg | Freq: Every day | RECTAL | Status: DC | PRN

## 2014-09-16 MED ORDER — SIMETHICONE 80 MG PO CHEW: 80.0000 mg | CHEWABLE_TABLET | ORAL | Status: DC | PRN

## 2014-09-16 MED ORDER — ONDANSETRON HCL 4 MG PO TABS: 4.0000 mg | ORAL_TABLET | ORAL | Status: DC | PRN

## 2014-09-16 MED ORDER — ONDANSETRON HCL 4 MG/2ML IJ SOLN: 4.0000 mg | INTRAMUSCULAR | Status: DC | PRN

## 2014-09-16 MED ORDER — METHYLERGONOVINE MALEATE 0.2 MG PO TABS: 0.2000 mg | ORAL_TABLET | ORAL | Status: DC | PRN

## 2014-09-16 MED ORDER — DIPHENHYDRAMINE HCL 25 MG PO CAPS: 25.0000 mg | ORAL_CAPSULE | Freq: Four times a day (QID) | ORAL | Status: DC | PRN

## 2014-09-16 MED ORDER — ZOLPIDEM TARTRATE 5 MG PO TABS: 5.0000 mg | ORAL_TABLET | Freq: Every evening | ORAL | Status: DC | PRN

## 2014-09-16 MED ORDER — OXYTOCIN 40 UNITS IN LACTATED RINGERS INFUSION - SIMPLE MED: 62.5000 mL/h | INTRAVENOUS | Status: DC | PRN

## 2014-09-16 MED ORDER — FLEET ENEMA 7-19 GM/118ML RE ENEM: 1.0000 | ENEMA | Freq: Every day | RECTAL | Status: DC | PRN

## 2014-09-16 MED ORDER — BENZOCAINE-MENTHOL 20-0.5 % EX AERO
1.0000 "application " | INHALATION_SPRAY | CUTANEOUS | Status: DC | PRN
  Administered 2014-09-16: 1 via TOPICAL
  Filled 2014-09-16: qty 56

## 2014-09-16 MED ORDER — PRENATAL MULTIVITAMIN CH
1.0000 | ORAL_TABLET | Freq: Every day | ORAL | Status: DC
  Administered 2014-09-16 – 2014-09-17 (×2): 1 via ORAL
  Filled 2014-09-16 (×2): qty 1

## 2014-09-16 MED ORDER — LANOLIN HYDROUS EX OINT: TOPICAL_OINTMENT | CUTANEOUS | Status: DC | PRN

## 2014-09-16 MED ORDER — TETANUS-DIPHTH-ACELL PERTUSSIS 5-2.5-18.5 LF-MCG/0.5 IM SUSP: 0.5000 mL | Freq: Once | INTRAMUSCULAR | Status: DC

## 2014-09-16 MED ORDER — MEASLES, MUMPS & RUBELLA VAC ~~LOC~~ INJ
0.5000 mL | INJECTION | Freq: Once | SUBCUTANEOUS | Status: DC
  Filled 2014-09-16: qty 0.5

## 2014-09-16 MED ORDER — IBUPROFEN 600 MG PO TABS
600.0000 mg | ORAL_TABLET | Freq: Four times a day (QID) | ORAL | Status: DC
  Administered 2014-09-16 – 2014-09-17 (×5): 600 mg via ORAL
  Filled 2014-09-16 (×5): qty 1

## 2014-09-16 NOTE — Progress Notes (Signed)
   Lindsey Mccoy is a 29 y.o. G3P1011 at [redacted]w[redacted]d  admitted for induction of labor due to Hydramnios.  Subjective: comfortabel with epidural   Objective: Filed Vitals:   09/16/14 0000 09/16/14 0030 09/16/14 0100 09/16/14 0130  BP: 111/68 100/60 93/60 101/60  Pulse: 84 82 87 80  Temp:      TempSrc:      Resp:      Height:      Weight:      SpO2:          FHT:  FHR: 145 bpm, variability: moderate,  accelerations:  Present,  decelerations:  Absent UC:   regular, every 2-3 minutes  MVU's ` 150 SVE:   Dilation: 7 Effacement (%): 50 Station: -3 Exam by:: Rodena Piety Pitocin @ 8 mu/min  Labs: Lab Results  Component Value Date   WBC 18.2* 09/15/2014   HGB 12.0 09/15/2014   HCT 34.8* 09/15/2014   MCV 93.3 09/15/2014   PLT 236 09/15/2014    Assessment / Plan: IOL, beginning active labor; labor nearly adequate  Labor: Progressing normally Fetal Wellbeing:  Category I Pain Control:  Epidural Anticipated MOD:  NSVD  CRESENZO-DISHMAN,Heinz Eckert 09/16/2014, 1:55 AM

## 2014-09-16 NOTE — Progress Notes (Signed)
S:  Patient resting comfortably with no complaints of pain.     O:  VS: Blood pressure 101/60, pulse 80, temperature 98.2 F (36.8 C), temperature source Oral, resp. rate 18, height 5' 0.5" (1.537 m), weight 64.411 kg (142 lb), last menstrual period 12/15/2013, SpO2 96 %.        FHR : baseline 140 / variability moderate / accelerations present / no decelerations        Toco: contractions every 3-5 min        Cervix : 6-7 cm, thick. Station is -2        Membranes: AROM at 2046         A: G3 P1011 in active labor s/p successful version at 2046 on 6/23.      P: IUPC insert and increse pitocin Anticipate NSVD   CRESENZO-DISHMAN,Lindsey Mccoy CNM 09/16/2014, 1:53 AM

## 2014-09-16 NOTE — Progress Notes (Signed)
   Lindsey Mccoy is a 29 y.o. G3P1011 at [redacted]w[redacted]d  admitted for induction of labor due to Hydramnios.  Subjective: comfortabel with epidural   Objective: Filed Vitals:   09/16/14 0245 09/16/14 0250 09/16/14 0300 09/16/14 0330  BP: 89/49 103/62 114/62 114/52  Pulse: 86 86 80 76  Temp:   98.2 F (36.8 C)   TempSrc:   Oral   Resp:    18  Height:      Weight:      SpO2:          FHT:  FHR: 145 bpm, variability: moderate,  accelerations:  Present,  decelerations:  Absent UC:   regular, every 2-3 minutes  MVU's 185 SVE:   Dilation: 8 Effacement (%): 90 Station: 0 Exam by:: C.Okoroji RN/N.Deal RN Pitocin @ 8 mu/min  Labs: Lab Results  Component Value Date   WBC 18.2* 09/15/2014   HGB 12.0 09/15/2014   HCT 34.8* 09/15/2014   MCV 93.3 09/15/2014   PLT 236 09/15/2014    Assessment / Plan: IOL for polyhydramnkios, active labor  Labor: Progressing normally Fetal Wellbeing:  Category I Pain Control:  Epidural Anticipated MOD:  NSVD  CRESENZO-DISHMAN,Nellie Chevalier 09/16/2014, 4:10 AM

## 2014-09-16 NOTE — Progress Notes (Signed)
UR chart review completed.  

## 2014-09-16 NOTE — Lactation Note (Signed)
This note was copied from the chart of Lindsey [employer]. Lactation Consultation Note  Patient Name: Lindsey Mccoy Today's Date: 09/16/2014   Baby 9 hours old. Per patient's RN Lupita Leash, mom has decided to bottle-feed formula.  Maternal Data    Feeding    [facility] Score/Interventions                      Lactation Tools Discussed/Used     Consult Status      Lindsey Mccoy 09/16/2014, 5:08 PM

## 2014-09-16 NOTE — Progress Notes (Signed)
Delivery Note  First Stage: Labor onset: 0900 on 6/23 Augmentation : cytotec, foley, pitocin Analgesia /Anesthesia intrapartum: epidural AROM at 2040  Second Stage: Complete dilation at 0700 on 6/24  Onset of pushing at 0710   Delivery of a viable infant female  At 67 by SNM in OA  position No nuchal cord Cord double clamped after cessation of pulsation, cut by grandmother Cord blood sample collected   I Third Stage: Placenta delivered intact shultz presentation intact with 3 VC @ 0722 Placenta disposition: hospital disposal Uterine tone firm / bleeding mild  Labial laceration identified  Anesthesia for repair: epidural Repaired with 2-0 vicryl Est. Blood Loss (mL): 300   Mom to postpartum.  Baby to Couplet care / Skin to Skin.  Newborn: Birth Weight: 6 lbs 7 oz  Apgar Scores: 7/10  Feeding planned: breast  Charlesetta Garibaldi KooistraSNM 09/16/2014, 7:51 AM   I was present for the delivery and assisted with the repair. CRESENZO-DISHMAN,Marymargaret Kirker

## 2014-09-16 NOTE — Anesthesia Postprocedure Evaluation (Signed)
  Anesthesia Post-op Note  Patient: Lindsey Mccoy  Procedure(s) Performed: * No procedures listed *  Patient Location: Mother/Baby  Anesthesia Type:Epidural  Level of Consciousness: awake, alert , oriented and patient cooperative  Airway and Oxygen Therapy: Patient Spontanous Breathing  Post-op Pain: none  Post-op Assessment: Post-op Vital signs reviewed, Patient's Cardiovascular Status Stable, Respiratory Function Stable, Patent Airway, No headache, No backache and Patient able to bend at knees              Post-op Vital Signs: Reviewed and stable  Last Vitals:  Filed Vitals:   09/16/14 1044  BP: 109/59  Pulse: 80  Temp: 36.8 C  Resp: 20    Complications: No apparent anesthesia complications

## 2014-09-17 MED ORDER — IBUPROFEN 600 MG PO TABS: 600.0000 mg | ORAL_TABLET | Freq: Four times a day (QID) | ORAL | Status: DC | PRN

## 2014-09-17 NOTE — Discharge Instructions (Signed)

## 2014-09-17 NOTE — Discharge Summary (Signed)
Obstetric Discharge Summary Reason for Admission: induction of labor for polyhydramnios Prenatal Procedures: none Intrapartum Procedures: spontaneous vaginal delivery and ECV  Postpartum Procedures: none Complications-Operative and Postpartum: labial laceration- repaired HEMOGLOBIN  Date Value Ref Range Status  09/15/2014 12.0 12.0 - 15.0 g/dL Final   HCT  Date Value Ref Range Status  09/15/2014 34.8* 36.0 - 46.0 % Final   Lindsey Mccoy is a 29yo G3P1011 admitted at 39.1wks for IOL due to poly. Her cx was initially ripened w/ cytotec and then a foley bulb was placed. Once the foley had come out but prior to the Pitocin being started, pt was identified as being in a breech presentation. Initially she was desirous of a c-section but then she requested an ECV after epidural placement for comfort. ECV was success, Pit was started, and pt went on to SVD. By PPD#1 she was doing well and was ready for discharge. She is bottlefeeding and would like Nexplanon for contraception.  Physical Exam:  General: alert, cooperative and no distress  Heart: RRR Lungs: nl effort Lochia: appropriate Uterine Fundus: firm DVT Evaluation: No evidence of DVT seen on physical exam.  Discharge Diagnoses: Term Pregnancy-delivered  Discharge Information: Date: 09/17/2014 Activity: pelvic rest Diet: routine Medications: PNV and Ibuprofen Condition: stable Instructions: refer to practice specific booklet Discharge to: home Follow-up Information    Follow up with [facility]. Schedule an appointment as soon as possible for a visit in 4 weeks.   Why:  For your postpartum appointment.   Contact information:   [address] [name] [place] [address] [phone]      Newborn Data: Live born female  Birth Weight: 6 lb 11.9 oz (3059 g) APGAR: 7, 10  Home with mother.  Lindsey Mccoy 09/17/2014, 7:54 AM

## 2014-09-18 NOTE — Progress Notes (Signed)
CLINICAL SOCIAL WORK MATERNAL/CHILD NOTE  Patient Details  Name: Lindsey Mccoy MRN: [id number] Date of Birth: 09/16/2012  Date: 09/18/2014  Clinical Social Worker Initiating Note: Rashida Ladouceur, LCSWDate/ Time Initiated: 09/18/14/1000   Child's Name: Lindsey Mccoy   Legal Guardian:  (Parents Richrd Humbles and Cala Bradford Mallozzi)   Need for Interpreter: None   Date of Referral: 09/17/14   Reason for Referral: Other (Comment)   Referral Source: [facility]   Address: [address] [place], [place] [address]  Phone number:  [phone])   Household Members: Relatives, Parents   Natural Supports (not living in the home): Extended Family   Professional Supports:None   Employment: (Parents currently unemployed and being supported by family)   Type of Work:     Education:     Surveyor, quantity Resources:Medicaid   Other Resources: Sales executive , [facility]   Cultural/Religious Considerations Which May Impact Care: none noted  Strengths: Ability to meet basic needs , Home prepared for child    Risk Factors/Current Problems: None   Cognitive State: Alert , Able to Concentrate    Mood/Affect: Happy    CSW Assessment: Acknowledged order for social work consult. Informed that mother left the floor and when she returned, there was a strong presence of marijuana on him. Questioned mother about this. She denies using marijuana, and seemed surprised by the observation that was made. She acknowledged going outside to smoke a [place] cigarette. UDS on newborn was negative. Mother denies any hx of mental illness. Informed that she is well prepared at home for newborn. No acute social concerns related by mother at this time. Mother informed of social work Surveyor, mining.  CSW Plan/Description:    Mother informed of the hospital's drug screening policy No barriers to discharge. Will continue to monitor drug screen.  Glenetta Kiger J,  LCSW 09/18/2014, 3:35 PM

## 2014-10-17 ENCOUNTER — Encounter: Payer: Self-pay | Admitting: Women's Health

## 2014-10-17 ENCOUNTER — Ambulatory Visit (INDEPENDENT_AMBULATORY_CARE_PROVIDER_SITE_OTHER): Payer: Medicaid Other | Admitting: Women's Health

## 2014-10-17 DIAGNOSIS — Z3009 Encounter for other general counseling and advice on contraception: Secondary | ICD-10-CM

## 2014-10-17 NOTE — Progress Notes (Signed)
Patient ID: Lindsey Mccoy, female   DOB: 04-Jan-1986, 29 y.o.   MRN: [id number] Subjective:    Lindsey Mccoy is a 29 y.o. G76P2012 Caucasian female who presents for a postpartum visit. She is 4 weeks postpartum following a spontaneous vaginal delivery at 39.2 gestational weeks after IOL for polyhydramnios. Had successful ECV @ 37wks d/t breech. Fetus was found to be breech intrapartum, underwent another successful ECV. Anesthesia: epidural. I have fully reviewed the prenatal and intrapartum course. Postpartum course has been uncomplicated. Baby's course has been uncomplicated. Baby is feeding by bottle. Bleeding no bleeding. Bowel function is normal. Bladder function is normal. Patient is not sexually active. Last sexual activity: prior to birth of baby. Contraception method is abstinence and wants nexplanon. Postpartum depression screening: negative. Score 3.  Last pap 2015 in [place] and was neg.  The following portions of the patient's history were reviewed and updated as appropriate: allergies, current medications, past medical history, past surgical history and problem list.  Review of Systems Pertinent items are noted in HPI.   Filed Vitals:   10/17/14 1039  BP: 98/60  Pulse: 92  Weight: 120 lb (54.432 kg)   No LMP recorded.  Objective:   General:  alert, cooperative and no distress   Breasts:  deferred, no complaints  Lungs: clear to auscultation bilaterally  Heart:  regular rate and rhythm  Abdomen: soft, nontender   Vulva: normal  Vagina: normal vagina  Cervix:  closed  Corpus: Well-involuted  Adnexa:  Non-palpable  Rectal Exam: No hemorrhoids        Assessment:   Postpartum exam 4 wks s/p SVB after IOL for polyhydramnios, had intrapartum ECV Bottlefeeding Depression screening Contraception counseling   Plan:   Contraception: abstinence until nexplanon Follow up in: 3 weeks for nexplanon insertion (order today) or earlier if needed  Joniqua, Sidle CNM,  [facility] 10/17/2014 10:55 AM

## 2014-11-07 ENCOUNTER — Ambulatory Visit (INDEPENDENT_AMBULATORY_CARE_PROVIDER_SITE_OTHER): Payer: Medicaid Other | Admitting: Women's Health

## 2014-11-07 ENCOUNTER — Encounter: Payer: Self-pay | Admitting: Women's Health

## 2014-11-07 VITALS — BP 102/62 | HR 88 | Wt 121.0 lb

## 2014-11-07 DIAGNOSIS — Z309 Encounter for contraceptive management, unspecified: Secondary | ICD-10-CM

## 2014-11-07 DIAGNOSIS — Z3202 Encounter for pregnancy test, result negative: Secondary | ICD-10-CM

## 2014-11-07 DIAGNOSIS — Z3200 Encounter for pregnancy test, result unknown: Secondary | ICD-10-CM

## 2014-11-07 LAB — POCT URINE PREGNANCY: Preg Test, Ur: NEGATIVE

## 2014-11-07 NOTE — Patient Instructions (Signed)
NO SEX UNTIL AFTER YOU GET YOUR BIRTH CONTROL   Come in Wednesday morning to [facility] at 8:00am to have your blood drawn, then come back to office at 1:30pm for your nexplanon

## 2014-11-07 NOTE — Progress Notes (Signed)
Patient ID: Lindsey Mccoy, female   DOB: Feb 19, 1986, 29 y.o.   MRN: [id number]   [facility] Visit  Patient name: Lindsey Mccoy MRN [id number]  Date of birth: 1986-03-17  CC & HPI:  Lindsey Mccoy is a 29 y.o. Caucasian female presenting today for nexplanon insertion, however she had sex on 10/29/14. Did use condom.   Pertinent History Reviewed:  Medical & Surgical Hx:   Past Medical History  Diagnosis Date  . Hypoglycemia   . Cyst of ovary, right   . Pregnant 04/14/2014  . Complication of anesthesia    Past Surgical History  Procedure Laterality Date  . No past surgeries     Medications: Reviewed & Updated - see associated section Social History: Reviewed -  reports that she has been smoking Cigarettes.  She has been smoking about 0.50 packs per day. She has never used smokeless tobacco.  Objective Findings:  Vitals: BP 102/62 mmHg  Pulse 88  Wt 121 lb (54.885 kg)  Breastfeeding? No  Physical Examination: General appearance - alert, well appearing, and in no distress  Results for orders placed or performed in visit on 11/07/14 (from the past 24 hour(s))  POCT urine pregnancy   Collection Time: 11/07/14 10:16 AM  Result Value Ref Range   Preg Test, Ur Negative Negative     Assessment & Plan:  A:   Not able to place nexplanon today d/t not being 14d from last sex for UPT to be reliable P:  Offered to return tomorrow am for bhcg/pm insertion or 8/22 for UPT and insertion, would like to do bhcg/insertion- next available appt for pm insertion isn't until wed 8/17   F/U 8/17 at 8am at [facility] for bhcg then pm for nexplanon insertion  NO SEX until after placed   Colletta, Spillers CNM, [facility] 11/07/2014 10:39 AM

## 2014-11-09 ENCOUNTER — Ambulatory Visit (INDEPENDENT_AMBULATORY_CARE_PROVIDER_SITE_OTHER): Payer: Medicaid Other | Admitting: Adult Health

## 2014-11-09 ENCOUNTER — Encounter: Payer: Self-pay | Admitting: Adult Health

## 2014-11-09 VITALS — BP 108/60 | HR 64 | Ht 60.0 in | Wt 121.0 lb

## 2014-11-09 DIAGNOSIS — Z30017 Encounter for initial prescription of implantable subdermal contraceptive: Secondary | ICD-10-CM

## 2014-11-09 DIAGNOSIS — Z30018 Encounter for initial prescription of other contraceptives: Secondary | ICD-10-CM

## 2014-11-09 HISTORY — DX: Encounter for initial prescription of implantable subdermal contraceptive: Z30.017

## 2014-11-09 LAB — BETA HCG QUANT (REF LAB): hCG Quant: <1 m[IU]/mL

## 2014-11-09 NOTE — Progress Notes (Signed)
Subjective:     Patient ID: Lindsey Mccoy, female   DOB: 09/14/1985, 29 y.o.   MRN: [id number]  HPI Lindsey Mccoy is a 29 year old white female in for nexplanon insertion.   Review of Systems For nexplanon insertion, Patient denies any headaches, hearing loss, fatigue, blurred vision, shortness of breath, chest pain, abdominal pain, problems with bowel movements, urination, or intercourse. No joint pain or mood swings. Reviewed past medical,surgical, social and family history. Reviewed medications and allergies.     Objective:   Physical Exam BP 108/60 mmHg  Pulse 64  Ht 5' (1.524 m)  Wt 121 lb (54.885 kg)  BMI 23.63 kg/m2  LMP 11/08/2014  Breastfeeding? No QHCG negative, Consent signed, time out called. Left arm cleansed with betadine, and injected with 1.5 cc 2% lidocaine and waited til numb. Nexplanon easily inserted and steri strips applied.Rod easily palpated by provider and pt. Pressure dressing applied.    Assessment:     Nexplanon insertion lot [id number]  Exp 11/18    Plan:     Use condoms x 4 weeks, keep clean and dry x 24 hours, no heavy lifting, keep steri strips on x 72 hours, Keep pressure dressing on x 24 hours. Follow up prn problems.   Pap and physical in 1 year

## 2014-11-09 NOTE — Patient Instructions (Signed)
Use condoms x 4 weeks, keep clean and dry x 24 hours, no heavy lifting, keep steri strips on x 72 hours, Keep pressure dressing on x 24 hours. Follow up prn problems. Pap and physical in 1 year  

## 2014-12-22 ENCOUNTER — Telehealth: Payer: Self-pay | Admitting: *Deleted

## 2014-12-22 ENCOUNTER — Emergency Department (HOSPITAL_COMMUNITY): Payer: Medicaid Other

## 2014-12-22 ENCOUNTER — Emergency Department (HOSPITAL_COMMUNITY)
Admission: EM | Admit: 2014-12-22 | Discharge: 2014-12-22 | Disposition: A | Discharge status: Home / Self Care | Payer: Medicaid Other | Attending: Emergency Medicine | Admitting: Emergency Medicine

## 2014-12-22 ENCOUNTER — Encounter (HOSPITAL_COMMUNITY): Payer: Self-pay

## 2014-12-22 DIAGNOSIS — K029 Dental caries, unspecified: Secondary | ICD-10-CM | POA: Insufficient documentation

## 2014-12-22 DIAGNOSIS — Z880 Allergy status to penicillin: Secondary | ICD-10-CM | POA: Diagnosis not present

## 2014-12-22 DIAGNOSIS — Z8639 Personal history of other endocrine, nutritional and metabolic disease: Secondary | ICD-10-CM | POA: Insufficient documentation

## 2014-12-22 DIAGNOSIS — Z720 Tobacco use: Secondary | ICD-10-CM | POA: Diagnosis not present

## 2014-12-22 DIAGNOSIS — R000 Tachycardia, unspecified: Secondary | ICD-10-CM | POA: Diagnosis not present

## 2014-12-22 DIAGNOSIS — Z8742 Personal history of other diseases of the female genital tract: Secondary | ICD-10-CM | POA: Insufficient documentation

## 2014-12-22 DIAGNOSIS — L03211 Cellulitis of face: Secondary | ICD-10-CM | POA: Insufficient documentation

## 2014-12-22 DIAGNOSIS — R51 Headache: Secondary | ICD-10-CM | POA: Diagnosis not present

## 2014-12-22 DIAGNOSIS — R600 Localized edema: Secondary | ICD-10-CM | POA: Diagnosis present

## 2014-12-22 MED ORDER — CLINDAMYCIN HCL 300 MG PO CAPS: 300.0000 mg | ORAL_CAPSULE | Freq: Three times a day (TID) | ORAL | Status: AC

## 2014-12-22 MED ORDER — CLINDAMYCIN HCL 150 MG PO CAPS
300.0000 mg | ORAL_CAPSULE | Freq: Once | ORAL | Status: AC
  Administered 2014-12-22: 300 mg via ORAL
  Filled 2014-12-22: qty 2

## 2014-12-22 NOTE — Telephone Encounter (Signed)
Pt c/o right side swelling in face. Pt states could the swelling be from the nexplanon. Pt informed do not believe the swelling is related to her BC, "not a common side effect of nexplanon" and she would need to go to the ER for evaluation. Pt verbalized understanding

## 2014-12-22 NOTE — ED Notes (Signed)
Pt states that when she got up she had swelling on the right side of face at 0800. Pt denies SOB

## 2014-12-22 NOTE — ED Provider Notes (Signed)
CSN: [id number]     Arrival date & time 12/22/14  1008 History  By signing my name below, I, Jarvis Morgan, attest that this documentation has been prepared under the direction and in the presence of Marily Memos, MD. Electronically Signed: Jarvis Morgan, ED Scribe. 12/22/2014. 10:25 AM.    Chief Complaint  Patient presents with  . Facial Swelling    right side    The history is provided by the patient. No language interpreter was used.    HPI Comments: Lindsey Mccoy is a 29 y.o. female who presents to the Emergency Department complaining of sudden onset,  intermittent, gradually worsening, moderate, swelling to the right side of her face that began 2 hours ago at 8:00 AM. Pt reports that she was not having any swelling when she went to bed at 3:00 AM. She endorses that she has mild associated tightness to the right side of her throat along with HA.  She denies any h/o this problem in the past. Pt denies any pain except when she presses on the swollen area and then it is mild. Pt has not had any meds PTA. She denies any recent dental pain or dental infection. She denies any recent travel. She denies any h/o sinus issues or recent sinus symptoms. She is a current everyday smoker with 0.25 ppd history for 12 years. Pt denies any rash, cough, trismus, nausea, vomiting, diarrhea, or vision changes.   Past Medical History  Diagnosis Date  . Hypoglycemia   . Cyst of ovary, right   . Pregnant 04/14/2014  . Complication of anesthesia   . Nexplanon insertion 11/09/2014    Inserted left arm 11/09/14  . Hypoglycemia    Past Surgical History  Procedure Laterality Date  . No past surgeries     Family History  Problem Relation Age of Onset  . Heart disease Paternal Grandfather   . Cancer Paternal Grandmother     breast  . Asthma Maternal Grandmother   . Diabetes Father   . Hypertension Father   . Migraines Mother   . Pancreatitis Brother    Social History  Substance Use Topics  .  Smoking status: Current Every Day Smoker -- 0.25 packs/day for 12 years    Types: Cigarettes  . Smokeless tobacco: Never Used  . Alcohol Use: No   OB History    Gravida Para Term Preterm AB TAB SAB Ectopic Multiple Living   0 1 0 1 0 0 2     Review of Systems  Constitutional: Negative for fever and chills.  HENT: Positive for facial swelling (right). Negative for dental problem and trouble swallowing.   Eyes: Negative for visual disturbance.  Respiratory: Negative for cough.   Gastrointestinal: Negative for nausea, vomiting and diarrhea.  Musculoskeletal: Negative for arthralgias.  Skin: Negative for rash.  Neurological: Positive for headaches.  All other systems reviewed and are negative.     Allergies  Mucinex; Penicillins; and Percocet  Home Medications   Prior to Admission medications   Medication Sig Start Date End Date Taking? Authorizing Provider  acetaminophen (TYLENOL) 325 MG tablet Take 650 mg by mouth every 6 (six) hours as needed.    Historical Provider, MD  ibuprofen (ADVIL,MOTRIN) 600 MG tablet Take 1 tablet (600 mg total) by mouth every 6 (six) hours as needed. 09/17/14   Arabella Merles, CNM   Triage Vitals: BP 122/99 mmHg  Pulse 115  Temp(Src) 98.5 F (36.9 C) (Oral)  Resp 16  Ht 5' (1.524 m)  Wt 121 lb (54.885 kg)  BMI 23.63 kg/m2  SpO2 100%  LMP 12/22/2014  Physical Exam  Constitutional: She is oriented to person, place, and time. She appears well-developed and well-nourished. No distress.  HENT:  Head: Normocephalic and atraumatic.  Nose: Right sinus exhibits maxillary sinus tenderness.  Mouth/Throat: Uvula is midline and oropharynx is clear and moist. Dental caries present. No dental abscesses.  slight swelling to right side of face  Eyes: Conjunctivae and EOM are normal.  Neck: Neck supple. No tracheal deviation present.  Cardiovascular: Regular rhythm and normal heart sounds.  Tachycardia present.   Sinus tachycardia   Pulmonary/Chest: Effort normal and breath sounds normal. No respiratory distress.  Musculoskeletal: Normal range of motion.  Neurological: She is alert and oriented to person, place, and time.  Skin: Skin is warm and dry.  Psychiatric: She has a normal mood and affect. Her behavior is normal.  Nursing note and vitals reviewed.   ED Course  Procedures (including critical care time)  DIAGNOSTIC STUDIES: Oxygen Saturation is 100% on RA, normal by my interpretation.    COORDINATION OF CARE: 10:25 AM- Will order CT maxillofacial w/o contrast.  Pt advised of plan for treatment and pt agrees.   Labs Review Labs Reviewed - No data to display  Imaging Review Ct Maxillofacial Wo Cm  12/22/2014   CLINICAL DATA:  Right facial swelling  EXAM: CT MAXILLOFACIAL WITHOUT CONTRAST  TECHNIQUE: Multidetector CT imaging of the maxillofacial structures was performed. Multiplanar CT image reconstructions were also generated. A small metallic BB was placed on the right temple in order to reliably differentiate right from left.  COMPARISON:  None.  FINDINGS: Mild soft tissue edema involving the right face overlying the maxilla and mandible area. This is in the subcutaneous fat with skin thickening. Findings suggest cellulitis. No fluid collection. The patient has severe dental infection however the soft tissue swelling appears remote from the teeth.  Severe dental disease with numerous caries throughout the upper lower teeth. Periapical lucency left upper canine consistent with periapical abscess.  Mucosal edema in the paranasal sinuses bilaterally spread. No air-fluid level in the sinuses.  Negative orbit bilaterally.  No orbital edema or mass lesion.  IMPRESSION: Soft tissue edema right face consistent with cellulitis. No abscess.  Extensive dental disease with numerous caries and periapical abscess left upper canine. Cellulitis right face is most likely not related to dental disease however dental infection is a  consideration.  Mucosal edema paranasal sinuses bilaterally.   Electronically Signed   By: Marlan Palau M.D.   On: 12/22/2014 11:13   I have personally reviewed and evaluated these images and lab results as part of my medical decision-making.   EKG Interpretation None      MDM   Final diagnoses:  Cellulitis of face   29 year old female with 6 hours of progressively worsening right face swelling. No fevers, nausea, vomiting. On exam she has multiple dental dental caries but no evidence of abscess. Secondary to concern for osteomyelitis versus sinus infection I order CT of her face which showed that she had likely cellulitis. Start on clindamycin and observed for 2 hours today having worsening of her swelling and she didn't have any dysphasia or dyspnea. Patient will be discharged on antibiotics as well as will return here for new or worsening symptoms.  I have personally and contemperaneously reviewed labs and imaging and used in my decision making as above.   A medical screening exam was performed  and I feel the patient has had an appropriate workup for their chief complaint at this time and likelihood of emergent condition existing is low. They have been counseled on decision, discharge, follow up and which symptoms necessitate immediate return to the emergency department. They or their family verbally stated understanding and agreement with plan and discharged in stable condition.   I personally performed the services described in this documentation, which was scribed in my presence. The recorded information has been reviewed and is accurate.   Marily Memos, MD 12/23/14 [id number]

## 2014-12-22 NOTE — Discharge Instructions (Signed)
°Emergency Department Resource Guide °1) Find a Doctor and Pay Out of Pocket °Although you won't have to find out who is covered by your insurance plan, it is a good idea to ask around and get recommendations. You will then need to call the office and see if the doctor you have chosen will accept you as a new patient and what types of options they offer for patients who are self-pay. Some doctors offer discounts or will set up payment plans for their patients who do not have insurance, but you will need to ask so you aren't surprised when you get to your appointment. ° °2) Contact Your Local Health Department °Not all health departments have doctors that can see patients for sick visits, but many do, so it is worth a call to see if yours does. If you don't know where your local health department is, you can check in your phone book. The CDC also has a tool to help you locate your state's health department, and many state websites also have listings of all of their local health departments. ° °3) Find a Walk-in Clinic °If your illness is not likely to be very severe or complicated, you may want to try a walk in clinic. These are popping up all over the country in pharmacies, drugstores, and shopping centers. They're usually staffed by nurse practitioners or physician assistants that have been trained to treat common illnesses and complaints. They're usually fairly quick and inexpensive. However, if you have serious medical issues or chronic medical problems, these are probably not your best option. ° °No Primary Care Doctor: °- Call [facility] at  832-8000 - they can help you locate a primary care doctor that  accepts your insurance, provides certain services, etc. °- Physician Referral Service- 1-800-533-3463 ° °Chronic Pain Problems: °Organization         Address  Phone   Notes  °[facility]  (336) 297-2271 Patients need to be referred by their primary care doctor.  ° °Medication  Assistance: °Organization         Address  Phone   Notes  °[place] [facility] [address] °[place], [place] [address] (336) 641-8030 --Must be a resident of [place] °-- Must have NO insurance coverage whatsoever (no Medicaid/ Medicare, etc.) °-- The pt. MUST have a primary care doctor that directs their care regularly and follows them in the community °  °[facility]  (866) 331-1348   °[employer]  (888) 892-1162   ° °Agencies that provide inexpensive medical care: °Organization         Address  Phone   Notes  °[facility]  (336) 832-8035   °[facility]    (336) 832-7272   °[facility] [address] °[place], [place] [address] (336) 832-4777   °[facility] [address], °[place] (336) 271-4999   °[facility]    (336) 373-0678   °[facility]    (336) 272-1050   °[facility] ° [address], [place] Phone:  (336) 832-4444, Fax:  (336) 832-4440 Hours of Operation:  9 am - 6 pm, M-F.  Also accepts Medicaid/Medicare and self-pay.  °[facility] ° [address], [place] Phone: (336) 832-3150, Fax: (336) 832-3151. Hours of Operation:  8:30 am - 5:30 pm, M-F.  Also accepts Medicaid and self-pay.  °[facility] [place] [address], [place] Phone: (336) 878-6027   °[facility] [address], [place], [place] (336)723-1848, Ext. 123 Mondays & Thursdays: 7-9 AM.  First 15 patients are seen on a first come, first serve basis. °  ° °Medicaid-accepting [place] Providers: ° °Organization         Address  Phone   Notes  °[facility] [address], [place] (336) 641-2100 Also accepts self-pay patients.  °[facility] [address], [place] ° (336) 856-9996   °[facility] [address], [place]  (336) 288-8857   °[facility] [address] [place] [address], [place] (336) 299-7000   °Veita Bland [address], [place]  ° (336) 373-1557 Only accepts [place] Access Medicaid patients after they have their name applied to their card.  ° °Self-Pay (no insurance) in [place]: ° °Organization         Address  Phone   Notes  °Sickle Cell Patients, [facility] [address], [place] (336) 832-1970   °[facility] [address], [place] (336) 832-4400   °[facility] [place] ° [address],  (336) 992-4800   °[facility]/Dr. Osei-Bonsu ° [address] [place] [address], [place] or [address], [place] (336) [phone] Phone number for both [place] and [place] locations is the same.  °[facility] [address], [place] (336) 299-0000   °[facility] [address] [place] [address], [place] or [address] (336) 852-7530 °(336) 878-2260   °[facility] [address], [place] (336) 350-1642, phone; (336) 294-5005, fax Sees patients 1st and 3rd Saturday of every month.  Must not qualify for public or private insurance (i.e. Medicaid, Medicare, [place] Health Choice, Veterans' Benefits) • Household income should be no more than 200% of the poverty level •The clinic cannot treat you if you are pregnant or think you are pregnant • Sexually transmitted diseases are not treated at the clinic.  ° ° °Dental Care: °Organization         Address  Phone  Notes  °[place] [facility] [address], [place] (336) 641-6152 Accepts children up to age 21 who are enrolled in Medicaid or [place] Health Choice; pregnant women with a Medicaid card; and children who have applied for Medicaid or [place] Health Choice, but were declined, whose parents can pay a reduced fee at time of service.  °[place]  [facility] [place]  [address], [place] (336) 641-7733 Accepts children up to age 21 who are enrolled in Medicaid or [place] Health Choice; pregnant women with a Medicaid card; and children who have applied for Medicaid or [place] Health Choice, but were declined, whose parents can pay a reduced fee at time of service.  °[facility] ° [address], [place] (336) 641-4533 Patients are seen by appointment only. Walk-ins are not accepted. [facility] will see patients 18 years of age and older. °Monday - Tuesday (8am-5pm) °Most Wednesdays (8:30-5pm) °$30 per visit, cash only  °[facility] ° [address], [place] (336) 641-4533 Patients are seen by appointment only. Walk-ins are not accepted. [facility] will see patients 18 years of age and older. °One   Wednesday Evening (Monthly: Volunteer Based).  $30 per visit, cash only  °[facility]  (919) 537-3737 for adults; Children under age 4, call Graduate Pediatric Dentistry at (919) [phone]. Children aged 4-14, please call (919) [phone] to request a pediatric application. ° Dental services are provided in all areas of dental care including fillings, crowns and bridges, complete and partial dentures, implants, gum treatment, root canals, and extractions. Preventive care is also provided. Treatment is provided to both adults and children. °Patients are selected via a lottery and there is often a waiting list. °  °[facility] [address], °[place] ° (336) 763-8833 [email or web address] °  °[facility] [address], [place], [place] (336)723-1848, Ext. 123 Second and Fourth Thursday of each month, opens at 6:30 AM; Clinic ends at 9 AM.  Patients are seen on a first-come first-served basis, and a limited number are seen during each clinic.  ° °[facility] ° [address], [place], [place] (336) 723-7904    Eligibility Requirements °You must have lived in [place], [place], or [place] counties for at least the last three months. °  You cannot be eligible for state or federal sponsored healthcare insurance, including Veterans Administration, Medicaid, or Medicare. °  You generally cannot be eligible for healthcare insurance through your employer.  °  How to apply: °Eligibility screenings are held every Tuesday and Wednesday afternoon from 1:00 pm until 4:00 pm. You do not need an appointment for the interview!  °[facility] [address], [place], [place] 336-631-2330   °[facility]  336-342-8273   °[facility]  336-703-3100   °[facility]  336-570-6415   ° °Behavioral Health Resources in the Community: °Intensive Outpatient Programs °Organization         Address  Phone  Notes  °[facility] [address], [place], [place] 336-878-6098   °[facility] [address], [place], [place] 336-832-9800   °[facility] [address], [place], [place] ° 336-882-2125   °[facility] [address],  °[place], [place] 1-800-853-5163 or 336-641-4981   °Substance Abuse Resources °Organization         Address  Phone  Notes  °[facility]  336-882-2125   °[facility]  336-784-9470   °[facility]  336-285-9073   °[facility]  336-845-3988   °Residential & Outpatient Substance Abuse Program  1-800-659-3381   °Psychological Services °Organization         Address  Phone  Notes  °[facility]  336- 832-9600   °[facility]  336- 378-7881   °[facility] [address], [place] 1-800-853-5163 or 336-641-4981   ° °Mobile Crisis Teams °Organization         Address  Phone  Notes  °[facility], Mobile Crisis Care Unit  1-877-626-1772   °Assertive °[facility] ° [address]  [place], [place] 336-834-9664   °Sharon DeEsch [address] °[place] 336-554-5454   ° °Self-Help/Support Groups °Organization         Address  Phone             Notes  °[facility] - variety of support groups  336- 373-1402 Call for more information  °Narcotics Anonymous (NA), [facility] [address], °[place]  2 meetings at this location  ° °  Residential Treatment Programs °Organization         Address  Phone  Notes  °[facility] [address],    °[place]  1-866-801-8205   °[facility] ° [address], [place], [place] 704-293-8524   °[facility] [address], [place] 336-845-3988 Admissions: 8am-3pm M-F  °[facility] [address]    °[place], [place] 336-841-1104   °[facility] [address], [place], [place] 336-379-7146   °[facility] [address]  °[place], [place] 336-285-9073   °[facility] Intensive Outpatient [address], [place], [place] 336-852-3033   °[facility] [address]  °[place], [place] 1-877-615-2722 or 336-784-9470   °[facility] [address] [place], [place] 336-227-7417 Accepts Medicaid  °[facility] [address]  °[place] 1-800-659-3381 Substance Abuse/Addiction Treatment  ° °[place] Behavioral Health Resources °Organization         Address  Phone  Notes  °[facility]  (888) 581-9988   °Julie Brannon, PhD [address] [place], [place]   (336) 349-5553 or (336) 951-0000   °[facility]   [address] °[place], [place] (336) 349-4454   °[facility] [address], [place], [place] (336) 342-8316 Insurance/Medicaid/sponsorship through [facility]  °[facility] [address]                                    [place], [place] (336) 342-8316 Therapy/tele-psych/case    °[facility] [address]  ° [place], [place] (336) 349-2233    °Dr. Arfeen  (336) 349-4544   °[facility] [place]  [employer] [place] [facility] 1) [address], [place] °2) [address], [place] °3)  [address], [place] (336) 349-3220 °(336) 342-7768 ° °(336) 342-8140   °[place] [facility] (336) 342-1394 or (336) 342-3537 (After Hours)    ° ° °

## 2016-11-01 IMAGING — CT CT MAXILLOFACIAL W/O CM
3 of 5 series · 16 of 47 positions shown, 19 images · non-contrast
Comparison: None.

CLINICAL DATA: Right facial swelling

EXAM:
CT MAXILLOFACIAL WITHOUT CONTRAST
TECHNIQUE: Multidetector CT imaging of the maxillofacial structures was
performed. Multiplanar CT image reconstructions were also generated.
A small metallic BB was placed on the right temple in order to
reliably differentiate right from left.

[Series 2: max soft 2.0 h31s · axial · 0.37mm/px · z∈[+13,+142]mm · 11 of 100 slices shown, 14 images]
[im 7/100  brain]
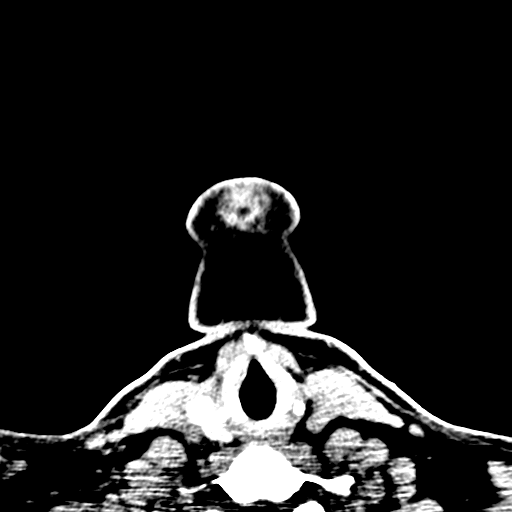
[im 7/100  bone]
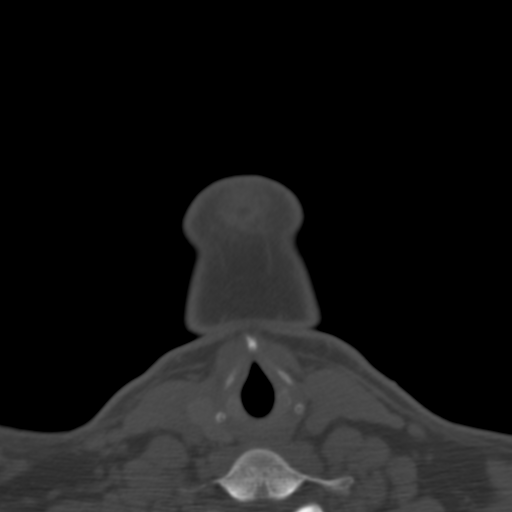
[im 14/100  bone]
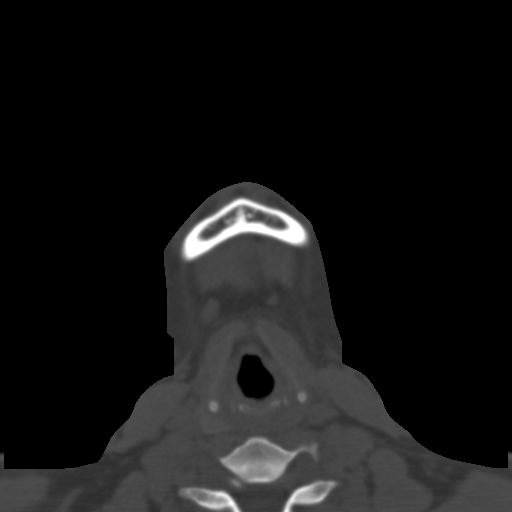
[im 24/100  bone]
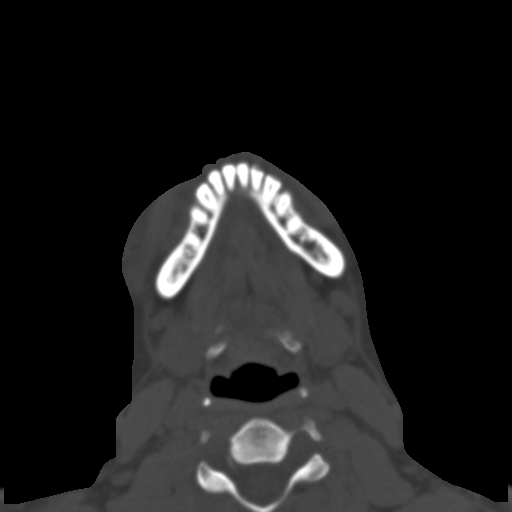
[im 31/100  bone]
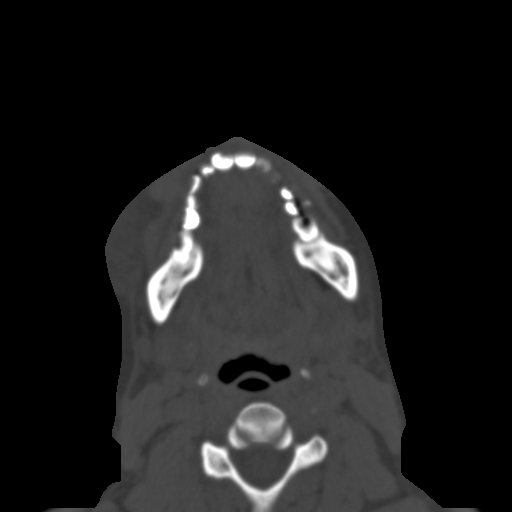
[im 41/100  brain]
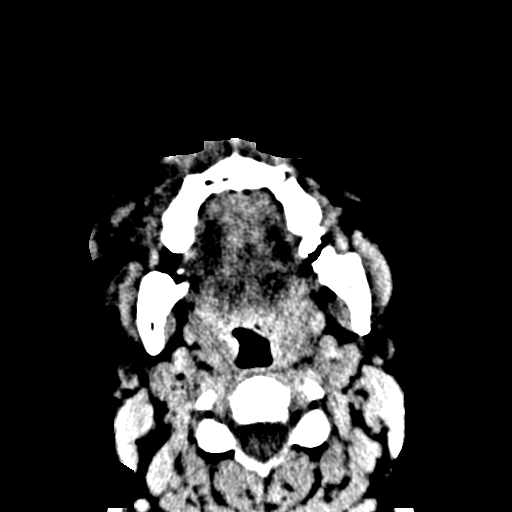
[im 41/100  bone]
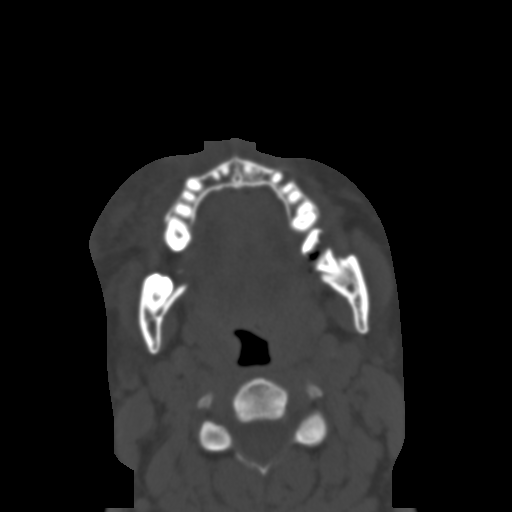
[im 52/100  bone]
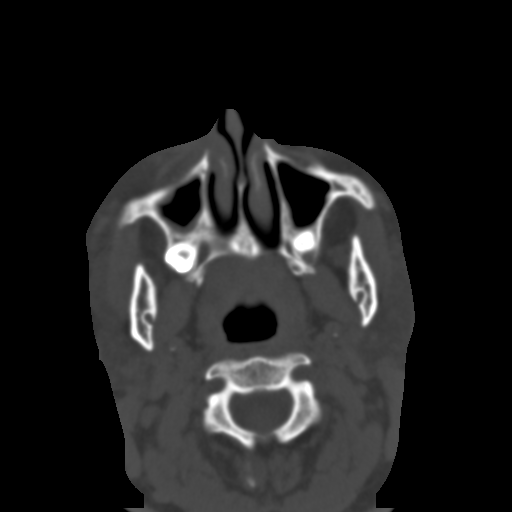
[im 59/100  bone]
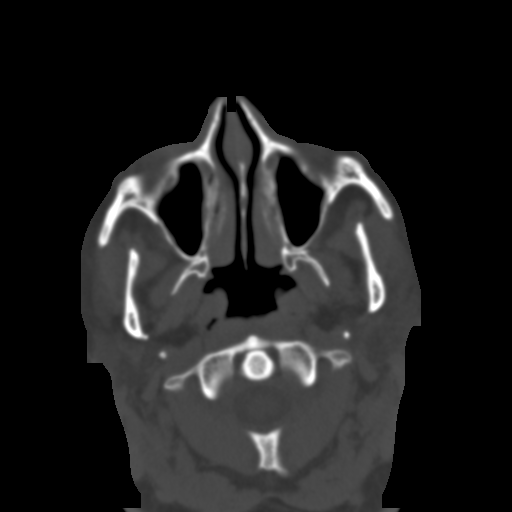
[im 69/100  bone]
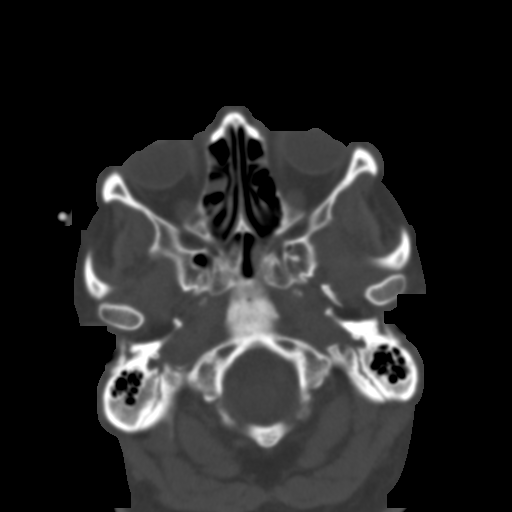
[im 76/100  brain]
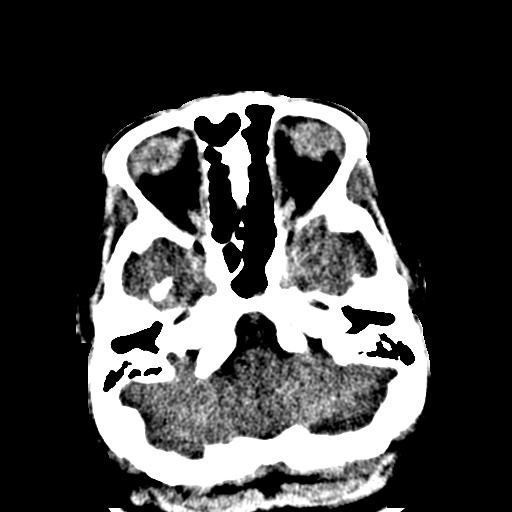
[im 76/100  bone]
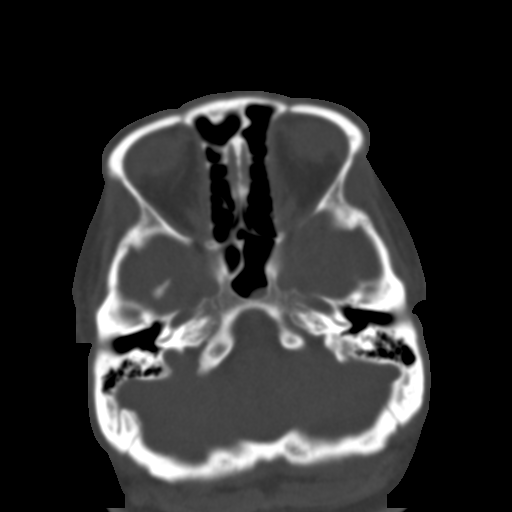
[im 86/100  bone]
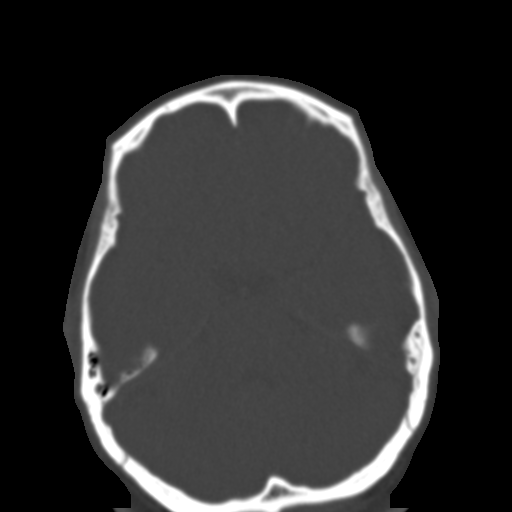
[im 93/100  bone]
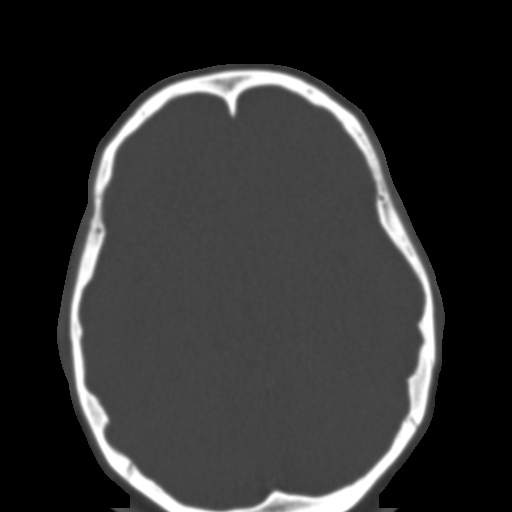

[Series 6: max bone coronal 2.0 spo · coronal · 0.28mm/px · 2 of 115 slices shown]
[im 39/115  bone]
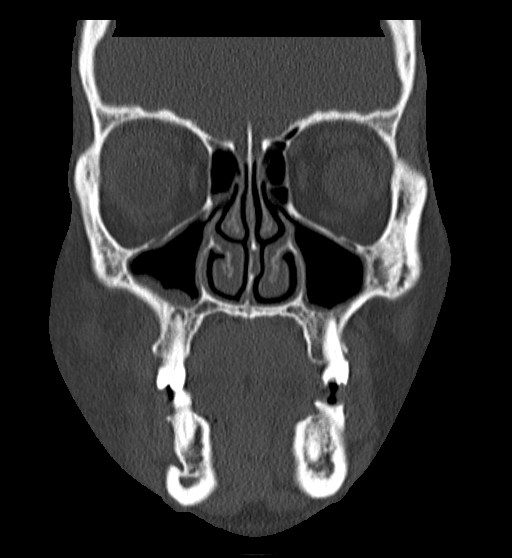
[im 77/115  bone]
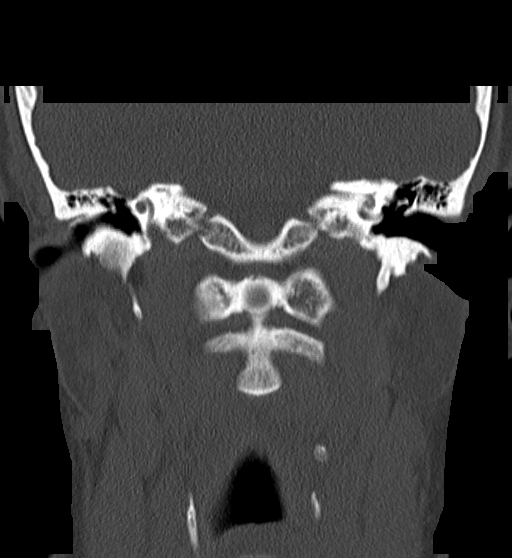

[Series 7: max bone sagittal 2.0 spo · sagittal · 0.29mm/px · 3 of 110 slices shown]
[im 37/110  bone]
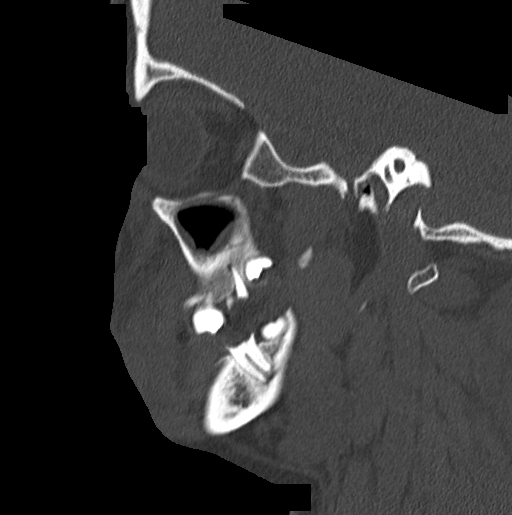
[im 55/110  bone]
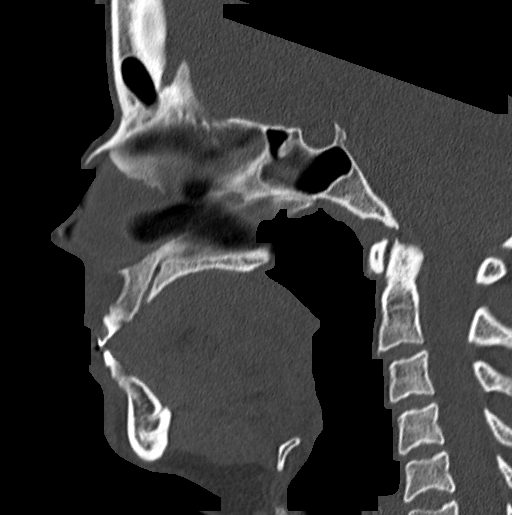
[im 73/110  bone]
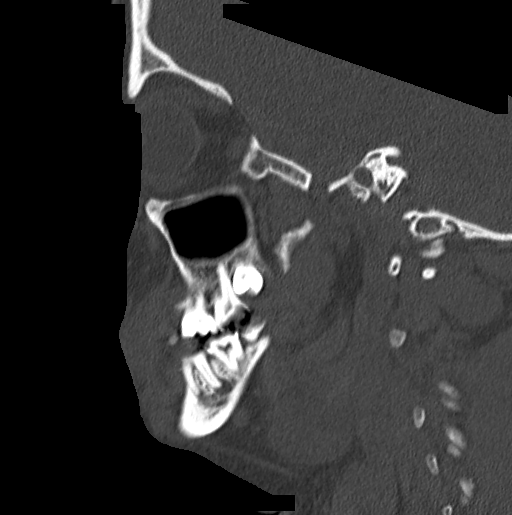

[16 of 47 positions shown; findings below may reference images not displayed]

FINDINGS: Mild soft tissue edema involving the right face overlying the
maxilla and mandible area. This is in the subcutaneous fat with skin
thickening. Findings suggest cellulitis. No fluid collection. The
patient has severe dental infection however the soft tissue swelling
appears remote from the teeth.

Severe dental disease with numerous caries throughout the upper
lower teeth. Periapical lucency left upper canine consistent with
periapical abscess.

Mucosal edema in the paranasal sinuses bilaterally spread. No
air-fluid level in the sinuses.

Negative orbit bilaterally.  No orbital edema or mass lesion.
IMPRESSION: Soft tissue edema right face consistent with cellulitis. No abscess.

Extensive dental disease with numerous caries and periapical abscess
left upper canine. Cellulitis right face is most likely not related
to dental disease however dental infection is a consideration.

Mucosal edema paranasal sinuses bilaterally.
